# Patient Record
Sex: Female | Born: 1950 | Race: White | Hispanic: No | Marital: Married | Smoking: Never smoker
Health system: Southern US, Community
[De-identification: ages and names within clinical notes are randomized; demographics above are authoritative.]

## PROBLEM LIST (undated history)

## (undated) DIAGNOSIS — K219 Gastro-esophageal reflux disease without esophagitis: Secondary | ICD-10-CM

## (undated) DIAGNOSIS — C50919 Malignant neoplasm of unspecified site of unspecified female breast: Secondary | ICD-10-CM

## (undated) DIAGNOSIS — I10 Essential (primary) hypertension: Secondary | ICD-10-CM

## (undated) DIAGNOSIS — D259 Leiomyoma of uterus, unspecified: Secondary | ICD-10-CM

## (undated) DIAGNOSIS — Z9221 Personal history of antineoplastic chemotherapy: Secondary | ICD-10-CM

## (undated) DIAGNOSIS — C495 Malignant neoplasm of connective and soft tissue of pelvis: Secondary | ICD-10-CM

## (undated) DIAGNOSIS — C801 Malignant (primary) neoplasm, unspecified: Secondary | ICD-10-CM

## (undated) DIAGNOSIS — E119 Type 2 diabetes mellitus without complications: Secondary | ICD-10-CM

## (undated) DIAGNOSIS — R911 Solitary pulmonary nodule: Secondary | ICD-10-CM

## (undated) DIAGNOSIS — M1990 Unspecified osteoarthritis, unspecified site: Secondary | ICD-10-CM

## (undated) DIAGNOSIS — M5030 Other cervical disc degeneration, unspecified cervical region: Secondary | ICD-10-CM

## (undated) DIAGNOSIS — C499 Malignant neoplasm of connective and soft tissue, unspecified: Secondary | ICD-10-CM

## (undated) DIAGNOSIS — E039 Hypothyroidism, unspecified: Secondary | ICD-10-CM

## (undated) HISTORY — DX: Gastro-esophageal reflux disease without esophagitis: K21.9

## (undated) HISTORY — DX: Solitary pulmonary nodule: R91.1

## (undated) HISTORY — DX: Hypothyroidism, unspecified: E03.9

## (undated) HISTORY — DX: Malignant neoplasm of connective and soft tissue of pelvis: C49.5

## (undated) HISTORY — PX: OTHER SURGICAL HISTORY: SHX169

## (undated) HISTORY — DX: Type 2 diabetes mellitus without complications: E11.9

## (undated) HISTORY — DX: Leiomyoma of uterus, unspecified: D25.9

## (undated) HISTORY — PX: SOFT TISSUE TUMOR RESECTION: SHX1054

---

## 1988-03-01 HISTORY — PX: OOPHORECTOMY: SHX86

## 1988-03-01 HISTORY — PX: SALPINGECTOMY: SHX328

## 2000-03-01 DIAGNOSIS — C50919 Malignant neoplasm of unspecified site of unspecified female breast: Secondary | ICD-10-CM

## 2000-03-01 HISTORY — DX: Malignant neoplasm of unspecified site of unspecified female breast: C50.919

## 2000-09-09 DIAGNOSIS — C801 Malignant (primary) neoplasm, unspecified: Secondary | ICD-10-CM

## 2000-09-09 HISTORY — PX: MASTECTOMY: SHX3

## 2000-09-09 HISTORY — DX: Malignant (primary) neoplasm, unspecified: C80.1

## 2001-06-29 HISTORY — PX: KNEE ARTHROSCOPY: SUR90

## 2003-11-30 ENCOUNTER — Ambulatory Visit: Payer: Self-pay | Admitting: Oncology

## 2004-02-14 ENCOUNTER — Ambulatory Visit: Payer: Self-pay | Admitting: Gastroenterology

## 2004-02-20 ENCOUNTER — Ambulatory Visit: Payer: Self-pay | Admitting: Oncology

## 2004-06-15 ENCOUNTER — Ambulatory Visit: Payer: Self-pay | Admitting: Oncology

## 2004-06-29 ENCOUNTER — Ambulatory Visit: Payer: Self-pay | Admitting: Oncology

## 2004-08-27 ENCOUNTER — Ambulatory Visit: Payer: Self-pay | Admitting: Oncology

## 2004-10-20 ENCOUNTER — Ambulatory Visit: Payer: Self-pay | Admitting: Oncology

## 2004-10-30 ENCOUNTER — Ambulatory Visit: Payer: Self-pay | Admitting: Oncology

## 2004-12-14 ENCOUNTER — Ambulatory Visit: Payer: Self-pay | Admitting: Oncology

## 2004-12-30 ENCOUNTER — Ambulatory Visit: Payer: Self-pay | Admitting: Oncology

## 2005-06-14 ENCOUNTER — Ambulatory Visit: Payer: Self-pay | Admitting: Oncology

## 2005-06-29 ENCOUNTER — Ambulatory Visit: Payer: Self-pay | Admitting: Oncology

## 2005-08-27 ENCOUNTER — Ambulatory Visit: Payer: Self-pay | Admitting: Oncology

## 2005-10-12 ENCOUNTER — Ambulatory Visit: Payer: Self-pay | Admitting: Oncology

## 2005-10-30 ENCOUNTER — Ambulatory Visit: Payer: Self-pay | Admitting: Oncology

## 2005-12-09 ENCOUNTER — Ambulatory Visit: Payer: Self-pay | Admitting: Oncology

## 2006-01-28 ENCOUNTER — Ambulatory Visit: Payer: Self-pay | Admitting: Oncology

## 2006-01-29 ENCOUNTER — Ambulatory Visit: Payer: Self-pay | Admitting: Oncology

## 2006-05-31 ENCOUNTER — Ambulatory Visit: Payer: Self-pay | Admitting: Oncology

## 2006-06-09 ENCOUNTER — Ambulatory Visit: Payer: Self-pay | Admitting: Oncology

## 2006-06-30 ENCOUNTER — Ambulatory Visit: Payer: Self-pay | Admitting: Oncology

## 2006-08-29 ENCOUNTER — Ambulatory Visit: Payer: Self-pay | Admitting: Oncology

## 2006-08-30 ENCOUNTER — Ambulatory Visit: Payer: Self-pay | Admitting: Unknown Physician Specialty

## 2006-09-21 ENCOUNTER — Ambulatory Visit: Payer: Self-pay | Admitting: Internal Medicine

## 2006-12-26 ENCOUNTER — Ambulatory Visit: Payer: Self-pay | Admitting: Oncology

## 2006-12-31 ENCOUNTER — Ambulatory Visit: Payer: Self-pay | Admitting: Oncology

## 2007-01-15 ENCOUNTER — Ambulatory Visit: Payer: Self-pay | Admitting: Internal Medicine

## 2007-01-15 ENCOUNTER — Other Ambulatory Visit: Payer: Self-pay

## 2007-02-17 ENCOUNTER — Ambulatory Visit: Payer: Self-pay | Admitting: Gastroenterology

## 2007-05-31 ENCOUNTER — Ambulatory Visit: Payer: Self-pay | Admitting: Oncology

## 2007-06-12 ENCOUNTER — Ambulatory Visit: Payer: Self-pay | Admitting: Gastroenterology

## 2007-06-13 ENCOUNTER — Ambulatory Visit: Payer: Self-pay | Admitting: Oncology

## 2007-06-30 ENCOUNTER — Ambulatory Visit: Payer: Self-pay | Admitting: Oncology

## 2007-08-01 ENCOUNTER — Ambulatory Visit: Payer: Self-pay | Admitting: Internal Medicine

## 2007-08-03 ENCOUNTER — Ambulatory Visit: Payer: Self-pay | Admitting: Internal Medicine

## 2007-08-08 ENCOUNTER — Ambulatory Visit: Payer: Self-pay | Admitting: Oncology

## 2007-08-21 ENCOUNTER — Ambulatory Visit: Payer: Self-pay | Admitting: Oncology

## 2007-08-30 ENCOUNTER — Ambulatory Visit: Payer: Self-pay | Admitting: Oncology

## 2007-09-30 ENCOUNTER — Ambulatory Visit: Payer: Self-pay | Admitting: Oncology

## 2007-10-31 ENCOUNTER — Ambulatory Visit: Payer: Self-pay | Admitting: Oncology

## 2007-11-15 ENCOUNTER — Ambulatory Visit: Payer: Self-pay | Admitting: Oncology

## 2007-11-30 ENCOUNTER — Ambulatory Visit: Payer: Self-pay | Admitting: Oncology

## 2008-04-01 ENCOUNTER — Ambulatory Visit: Payer: Self-pay | Admitting: Oncology

## 2008-04-04 ENCOUNTER — Ambulatory Visit: Payer: Self-pay | Admitting: Oncology

## 2008-04-29 ENCOUNTER — Ambulatory Visit: Payer: Self-pay | Admitting: Oncology

## 2008-07-30 ENCOUNTER — Ambulatory Visit: Payer: Self-pay | Admitting: Oncology

## 2008-08-13 ENCOUNTER — Ambulatory Visit: Payer: Self-pay | Admitting: Oncology

## 2008-08-29 ENCOUNTER — Ambulatory Visit: Payer: Self-pay | Admitting: Oncology

## 2008-09-10 ENCOUNTER — Ambulatory Visit: Payer: Self-pay | Admitting: Unknown Physician Specialty

## 2008-09-29 ENCOUNTER — Ambulatory Visit: Payer: Self-pay | Admitting: Oncology

## 2008-12-03 ENCOUNTER — Ambulatory Visit: Payer: Self-pay | Admitting: Oncology

## 2008-12-30 ENCOUNTER — Ambulatory Visit: Payer: Self-pay | Admitting: Oncology

## 2009-01-29 ENCOUNTER — Ambulatory Visit: Payer: Self-pay | Admitting: Oncology

## 2009-06-29 ENCOUNTER — Ambulatory Visit: Payer: Self-pay | Admitting: Oncology

## 2009-07-22 ENCOUNTER — Ambulatory Visit: Payer: Self-pay | Admitting: Oncology

## 2009-07-30 ENCOUNTER — Ambulatory Visit: Payer: Self-pay | Admitting: Oncology

## 2009-09-15 ENCOUNTER — Ambulatory Visit: Payer: Self-pay | Admitting: Oncology

## 2009-10-30 ENCOUNTER — Ambulatory Visit: Payer: Self-pay | Admitting: Radiation Oncology

## 2009-11-05 ENCOUNTER — Ambulatory Visit: Payer: Self-pay | Admitting: Radiation Oncology

## 2009-11-29 ENCOUNTER — Ambulatory Visit: Payer: Self-pay | Admitting: Radiation Oncology

## 2009-12-30 ENCOUNTER — Ambulatory Visit: Payer: Self-pay | Admitting: Radiation Oncology

## 2010-01-29 ENCOUNTER — Ambulatory Visit: Payer: Self-pay | Admitting: Radiation Oncology

## 2010-03-01 ENCOUNTER — Ambulatory Visit: Payer: Self-pay | Admitting: Radiation Oncology

## 2010-07-13 ENCOUNTER — Ambulatory Visit: Payer: Self-pay | Admitting: Oncology

## 2010-07-31 ENCOUNTER — Ambulatory Visit: Payer: Self-pay | Admitting: Oncology

## 2010-09-17 ENCOUNTER — Ambulatory Visit: Payer: Self-pay | Admitting: Oncology

## 2010-12-05 ENCOUNTER — Ambulatory Visit: Payer: Self-pay | Admitting: Internal Medicine

## 2011-01-14 ENCOUNTER — Ambulatory Visit: Payer: Self-pay | Admitting: Oncology

## 2011-01-16 LAB — CANCER ANTIGEN 27.29: CA 27.29: 17 U/mL (ref 0.0–38.6)

## 2011-01-30 ENCOUNTER — Ambulatory Visit: Payer: Self-pay | Admitting: Oncology

## 2011-07-06 DIAGNOSIS — C419 Malignant neoplasm of bone and articular cartilage, unspecified: Secondary | ICD-10-CM | POA: Insufficient documentation

## 2011-07-16 ENCOUNTER — Ambulatory Visit: Payer: Self-pay | Admitting: Oncology

## 2011-07-16 LAB — CBC CANCER CENTER
Basophil %: 0.5 %
Eosinophil #: 0 x10 3/mm (ref 0.0–0.7)
Eosinophil %: 0.8 %
HCT: 36.9 % (ref 35.0–47.0)
Lymphocyte #: 1 x10 3/mm (ref 1.0–3.6)
MCH: 27.4 pg (ref 26.0–34.0)
MCHC: 33.4 g/dL (ref 32.0–36.0)
Monocyte %: 5.6 %
Neutrophil #: 3.2 x10 3/mm (ref 1.4–6.5)
Neutrophil %: 70.6 %
RBC: 4.5 10*6/uL (ref 3.80–5.20)
RDW: 14.6 % — ABNORMAL HIGH (ref 11.5–14.5)
WBC: 4.6 x10 3/mm (ref 3.6–11.0)

## 2011-07-16 LAB — COMPREHENSIVE METABOLIC PANEL
Albumin: 3.8 g/dL (ref 3.4–5.0)
Alkaline Phosphatase: 103 U/L (ref 50–136)
Anion Gap: 10 (ref 7–16)
Calcium, Total: 9.1 mg/dL (ref 8.5–10.1)
Co2: 26 mmol/L (ref 21–32)
EGFR (African American): >60
EGFR (Non-African Amer.): >60
Osmolality: 280 (ref 275–301)
Potassium: 3.7 mmol/L (ref 3.5–5.1)

## 2011-07-21 LAB — CANCER ANTIGEN 27.29: CA 27.29: 28.2 U/mL (ref 0.0–38.6)

## 2011-07-31 ENCOUNTER — Ambulatory Visit: Payer: Self-pay | Admitting: Oncology

## 2011-09-21 ENCOUNTER — Ambulatory Visit: Payer: Self-pay | Admitting: Oncology

## 2012-01-11 DIAGNOSIS — R911 Solitary pulmonary nodule: Secondary | ICD-10-CM | POA: Insufficient documentation

## 2012-01-21 ENCOUNTER — Ambulatory Visit: Payer: Self-pay | Admitting: Oncology

## 2012-01-21 LAB — COMPREHENSIVE METABOLIC PANEL
Albumin: 3.7 g/dL (ref 3.4–5.0)
Alkaline Phosphatase: 129 U/L (ref 50–136)
Anion Gap: 8 (ref 7–16)
BUN: 15 mg/dL (ref 7–18)
Bilirubin,Total: 0.3 mg/dL (ref 0.2–1.0)
Calcium, Total: 9 mg/dL (ref 8.5–10.1)
Chloride: 101 mmol/L (ref 98–107)
Creatinine: 1.02 mg/dL (ref 0.60–1.30)
EGFR (Non-African Amer.): 60 — ABNORMAL LOW
Glucose: 114 mg/dL — ABNORMAL HIGH (ref 65–99)
Osmolality: 281 (ref 275–301)
Potassium: 3.7 mmol/L (ref 3.5–5.1)
Sodium: 140 mmol/L (ref 136–145)
Total Protein: 7.7 g/dL (ref 6.4–8.2)

## 2012-01-21 LAB — CBC CANCER CENTER
Basophil #: 0 x10 3/mm (ref 0.0–0.1)
Eosinophil %: 1.4 %
Lymphocyte #: 0.9 x10 3/mm — ABNORMAL LOW (ref 1.0–3.6)
Lymphocyte %: 22 %
MCH: 27.1 pg (ref 26.0–34.0)
MCV: 81 fL (ref 80–100)
Monocyte %: 6.7 %
Neutrophil #: 2.7 x10 3/mm (ref 1.4–6.5)
Platelet: 215 x10 3/mm (ref 150–440)
RBC: 4.67 10*6/uL (ref 3.80–5.20)
WBC: 3.9 x10 3/mm (ref 3.6–11.0)

## 2012-01-22 LAB — CANCER ANTIGEN 27.29: CA 27.29: 24.2 U/mL (ref 0.0–38.6)

## 2012-01-30 ENCOUNTER — Ambulatory Visit: Payer: Self-pay | Admitting: Oncology

## 2012-02-04 LAB — HM PAP SMEAR: HM Pap smear: NEGATIVE

## 2012-03-01 HISTORY — PX: COLONOSCOPY: SHX174

## 2012-03-16 ENCOUNTER — Ambulatory Visit: Payer: Self-pay | Admitting: Obstetrics and Gynecology

## 2012-03-16 LAB — BASIC METABOLIC PANEL
Anion Gap: 7 (ref 7–16)
BUN: 11 mg/dL (ref 7–18)
Co2: 29 mmol/L (ref 21–32)
EGFR (African American): >60
EGFR (Non-African Amer.): >60
Sodium: 140 mmol/L (ref 136–145)

## 2012-03-16 LAB — CBC
HCT: 37 % (ref 35.0–47.0)
MCHC: 34.2 g/dL (ref 32.0–36.0)
MCV: 80 fL (ref 80–100)
RBC: 4.64 10*6/uL (ref 3.80–5.20)
RDW: 14.6 % — ABNORMAL HIGH (ref 11.5–14.5)
WBC: 4.8 10*3/uL (ref 3.6–11.0)

## 2012-03-20 ENCOUNTER — Ambulatory Visit: Payer: Self-pay | Admitting: Obstetrics and Gynecology

## 2012-06-28 ENCOUNTER — Ambulatory Visit: Payer: Self-pay | Admitting: Gastroenterology

## 2012-06-29 LAB — PATHOLOGY REPORT

## 2012-07-17 ENCOUNTER — Ambulatory Visit: Payer: Self-pay | Admitting: Oncology

## 2012-07-21 ENCOUNTER — Ambulatory Visit: Payer: Self-pay | Admitting: Oncology

## 2012-07-30 ENCOUNTER — Ambulatory Visit: Payer: Self-pay | Admitting: Oncology

## 2013-02-07 ENCOUNTER — Ambulatory Visit: Payer: Self-pay | Admitting: Obstetrics and Gynecology

## 2013-07-25 ENCOUNTER — Ambulatory Visit: Payer: Self-pay | Admitting: Oncology

## 2013-08-03 ENCOUNTER — Ambulatory Visit: Payer: Self-pay | Admitting: Oncology

## 2013-08-03 LAB — CBC CANCER CENTER
Basophil #: 0 x10 3/mm (ref 0.0–0.1)
Basophil %: 0.6 %
EOS ABS: 0.1 x10 3/mm (ref 0.0–0.7)
Eosinophil %: 1.7 %
HCT: 38.9 % (ref 35.0–47.0)
HGB: 12.5 g/dL (ref 12.0–16.0)
LYMPHS ABS: 1 x10 3/mm (ref 1.0–3.6)
LYMPHS PCT: 20.3 %
MCH: 25.8 pg — AB (ref 26.0–34.0)
MCHC: 32.1 g/dL (ref 32.0–36.0)
MCV: 81 fL (ref 80–100)
MONO ABS: 0.3 x10 3/mm (ref 0.2–0.9)
Monocyte %: 6.6 %
Neutrophil #: 3.5 x10 3/mm (ref 1.4–6.5)
Neutrophil %: 70.8 %
Platelet: 267 x10 3/mm (ref 150–440)
RBC: 4.83 10*6/uL (ref 3.80–5.20)
RDW: 15.6 % — ABNORMAL HIGH (ref 11.5–14.5)
WBC: 5 x10 3/mm (ref 3.6–11.0)

## 2013-08-03 LAB — COMPREHENSIVE METABOLIC PANEL
ALT: 23 U/L (ref 12–78)
Albumin: 3.8 g/dL (ref 3.4–5.0)
Alkaline Phosphatase: 115 U/L
Anion Gap: 7 (ref 7–16)
BUN: 12 mg/dL (ref 7–18)
Bilirubin,Total: 0.4 mg/dL (ref 0.2–1.0)
CHLORIDE: 98 mmol/L (ref 98–107)
Calcium, Total: 9.5 mg/dL (ref 8.5–10.1)
Co2: 32 mmol/L (ref 21–32)
Creatinine: 0.9 mg/dL (ref 0.60–1.30)
EGFR (Non-African Amer.): >60
Glucose: 121 mg/dL — ABNORMAL HIGH (ref 65–99)
Osmolality: 275 (ref 275–301)
Potassium: 3.4 mmol/L — ABNORMAL LOW (ref 3.5–5.1)
SGOT(AST): 18 U/L (ref 15–37)
SODIUM: 137 mmol/L (ref 136–145)
Total Protein: 7.9 g/dL (ref 6.4–8.2)

## 2013-08-06 LAB — CANCER ANTIGEN 27.29: CA 27.29: 24.7 U/mL (ref 0.0–38.6)

## 2013-08-29 ENCOUNTER — Ambulatory Visit: Payer: Self-pay | Admitting: Oncology

## 2013-10-03 DIAGNOSIS — E785 Hyperlipidemia, unspecified: Secondary | ICD-10-CM | POA: Insufficient documentation

## 2013-10-03 DIAGNOSIS — K219 Gastro-esophageal reflux disease without esophagitis: Secondary | ICD-10-CM | POA: Insufficient documentation

## 2013-10-03 DIAGNOSIS — I10 Essential (primary) hypertension: Secondary | ICD-10-CM | POA: Insufficient documentation

## 2013-10-03 DIAGNOSIS — E010 Iodine-deficiency related diffuse (endemic) goiter: Secondary | ICD-10-CM | POA: Insufficient documentation

## 2013-10-03 DIAGNOSIS — E1159 Type 2 diabetes mellitus with other circulatory complications: Secondary | ICD-10-CM | POA: Insufficient documentation

## 2013-10-03 DIAGNOSIS — E7800 Pure hypercholesterolemia, unspecified: Secondary | ICD-10-CM | POA: Insufficient documentation

## 2013-11-18 DIAGNOSIS — M5136 Other intervertebral disc degeneration, lumbar region: Secondary | ICD-10-CM | POA: Insufficient documentation

## 2013-11-18 DIAGNOSIS — M51369 Other intervertebral disc degeneration, lumbar region without mention of lumbar back pain or lower extremity pain: Secondary | ICD-10-CM | POA: Insufficient documentation

## 2014-02-01 ENCOUNTER — Ambulatory Visit: Payer: Self-pay | Admitting: Oncology

## 2014-03-01 ENCOUNTER — Ambulatory Visit: Payer: Self-pay | Admitting: Oncology

## 2014-05-23 DIAGNOSIS — E1169 Type 2 diabetes mellitus with other specified complication: Secondary | ICD-10-CM | POA: Insufficient documentation

## 2014-05-23 DIAGNOSIS — R7303 Prediabetes: Secondary | ICD-10-CM | POA: Insufficient documentation

## 2014-06-21 NOTE — Op Note (Signed)
DATE OF BIRTH:  1950/05/01  DATE OF PROCEDURE:  03/20/2012  PREOPERATIVE DIAGNOSES:  1.  Thickened endometrium.  2.  Endometrial polyps.   POSTOPERATIVE DIAGNOSES:  1.  Thickened endometrium.  2.  Endometrial polyps.   OPERATIVE PROCEDURE:  Diagnostic hysteroscopy with dilation and curettage of endometrium.   SURGEON:  Alanda Slim. Issabela Lesko, MD  FIRST ASSISTANT:  None.   ANESTHESIA:  General.   INDICATIONS:  The patient is a 64 year old menopausal white female, who presents for hysteroscopy and D and C due to findings of probable endometrial polyps and a thickened endometrium on ultrasound. 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   FINDINGS AT SURGERY:  Uterus was of normal size and shape; the uterus sounded to 8 cm. Hysteroscopy demonstrated multiple small endometrial polyps.   DESCRIPTION OF THE PROCEDURE:  The patient was brought to the operating room where she was placed in the supine position. General anesthesia was induced without difficulty. She was placed in the dorsal lithotomy position using the candy cane stirrups. A Betadine perineal-intravaginal prep and drape was performed in the standard fashion. Red Robinson catheter was used to drain 100 mL of urine from the bladder. A weighted speculum was placed into the vagina, and a single-tooth tenaculum was placed on the anterior lip of the cervix. Uterus was sounded to 8 cm. Hanks dilators were used to a #20 [place] caliber to dilate the endocervical canal. The [facility] hysteroscope was used with lactated Ringer's as irrigant to view the intrauterine cavity. The above-noted findings were photo documented. The stone polyp forceps were then used to extract some polyps. This was followed by curettage with both smooth and serrated curettes to remove the residual tissue and polyps. Repeat hysteroscopy, along with small polyp forceps, was used to extract residual polyps left  within the endometrial cavity. Upon completion of this procedure, all instrumentation was removed from the vagina. The patient was then awakened, mobilized and taken to the recovery room in satisfactory condition. Estimated blood loss was minimal. Complications were none. All instruments, needle and sponge counts were verified as correct.    ____________________________ Alanda Slim. Ysabel Stankovich, MD mad:ms D: 03/20/2012 15:23:16 ET T: 03/20/2012 22:56:11 ET JOB#: [id number]  cc: Hassell Done A. Pat Sires, MD, <Dictator> [facility] [place] MD ELECTRONICALLY SIGNED 03/24/2012 8:02

## 2014-07-31 ENCOUNTER — Inpatient Hospital Stay (HOSPITAL_BASED_OUTPATIENT_CLINIC_OR_DEPARTMENT_OTHER): Payer: 59 | Admitting: Oncology

## 2014-07-31 ENCOUNTER — Inpatient Hospital Stay: Payer: 59 | Attending: Oncology

## 2014-07-31 ENCOUNTER — Encounter (INDEPENDENT_AMBULATORY_CARE_PROVIDER_SITE_OTHER): Payer: Self-pay

## 2014-07-31 VITALS — BP 144/84 | HR 78 | Temp 95.6°F | Wt 264.8 lb

## 2014-07-31 DIAGNOSIS — Z170 Estrogen receptor positive status [ER+]: Secondary | ICD-10-CM | POA: Diagnosis not present

## 2014-07-31 DIAGNOSIS — Z853 Personal history of malignant neoplasm of breast: Secondary | ICD-10-CM | POA: Insufficient documentation

## 2014-07-31 DIAGNOSIS — Z9221 Personal history of antineoplastic chemotherapy: Secondary | ICD-10-CM

## 2014-07-31 DIAGNOSIS — R918 Other nonspecific abnormal finding of lung field: Secondary | ICD-10-CM | POA: Diagnosis not present

## 2014-07-31 DIAGNOSIS — E039 Hypothyroidism, unspecified: Secondary | ICD-10-CM | POA: Diagnosis not present

## 2014-07-31 DIAGNOSIS — Z85831 Personal history of malignant neoplasm of soft tissue: Secondary | ICD-10-CM | POA: Insufficient documentation

## 2014-07-31 DIAGNOSIS — Z79899 Other long term (current) drug therapy: Secondary | ICD-10-CM

## 2014-07-31 DIAGNOSIS — C50911 Malignant neoplasm of unspecified site of right female breast: Secondary | ICD-10-CM

## 2014-07-31 DIAGNOSIS — M1990 Unspecified osteoarthritis, unspecified site: Secondary | ICD-10-CM | POA: Insufficient documentation

## 2014-07-31 DIAGNOSIS — R079 Chest pain, unspecified: Secondary | ICD-10-CM | POA: Insufficient documentation

## 2014-07-31 DIAGNOSIS — Z9011 Acquired absence of right breast and nipple: Secondary | ICD-10-CM | POA: Insufficient documentation

## 2014-07-31 DIAGNOSIS — Z9223 Personal history of estrogen therapy: Secondary | ICD-10-CM

## 2014-07-31 LAB — CBC
HCT: 41.4 % (ref 35.0–47.0)
Hemoglobin: 13.7 g/dL (ref 12.0–16.0)
MCH: 27.6 pg (ref 26.0–34.0)
MCHC: 33.2 g/dL (ref 32.0–36.0)
MCV: 83.3 fL (ref 80.0–100.0)
Platelets: 259 10*3/uL (ref 150–440)
RBC: 4.97 MIL/uL (ref 3.80–5.20)
RDW: 15.3 % — AB (ref 11.5–14.5)
WBC: 5.6 10*3/uL (ref 3.6–11.0)

## 2014-07-31 LAB — COMPREHENSIVE METABOLIC PANEL
ALBUMIN: 4.3 g/dL (ref 3.5–5.0)
ALK PHOS: 94 U/L (ref 38–126)
ALT: 17 U/L (ref 14–54)
AST: 18 U/L (ref 15–41)
Anion gap: 5 (ref 5–15)
BILIRUBIN TOTAL: 0.6 mg/dL (ref 0.3–1.2)
BUN: 12 mg/dL (ref 6–20)
CALCIUM: 9.3 mg/dL (ref 8.9–10.3)
CO2: 32 mmol/L (ref 22–32)
Chloride: 99 mmol/L — ABNORMAL LOW (ref 101–111)
Creatinine, Ser: 0.66 mg/dL (ref 0.44–1.00)
GLUCOSE: 119 mg/dL — AB (ref 65–99)
POTASSIUM: 3.9 mmol/L (ref 3.5–5.1)
SODIUM: 136 mmol/L (ref 135–145)
TOTAL PROTEIN: 7.9 g/dL (ref 6.5–8.1)

## 2014-07-31 NOTE — Progress Notes (Signed)
  Pt has never smoked.  Does not have living will.

## 2014-08-01 ENCOUNTER — Telehealth: Payer: Self-pay | Admitting: *Deleted

## 2014-08-01 NOTE — Telephone Encounter (Signed)
What kind of supplies are needed and how many of each does the patient need?

## 2014-08-01 NOTE — Telephone Encounter (Signed)
Patient needs 3 mastectomy bras and 1 prosthesis.

## 2014-08-01 NOTE — Telephone Encounter (Signed)
Prescription for mastectomy supplies completed and given to patient.

## 2014-08-02 ENCOUNTER — Encounter: Payer: Self-pay | Admitting: Oncology

## 2014-08-02 NOTE — Progress Notes (Signed)
[place] @ [facility] [phone]) [phone]  Fax:([phone]) [place]: Aug 13, 1950  MR#: [id number]  BZJ#:[id number]  Patient Care Team: Ezequiel Kayser, MD as PCP - General (Internal Medicine)  CHIEF COMPLAINT:  Chief Complaint  Patient presents with  . 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 Estrogen receptor greater than 90%. Progesterone receptor greater than 90%.    Previous therapy:   1. Right mastectomy and axillary lymph node dissection.  2. Adriamycin and Cytoxan.  3. Tamoxifen 20 mg daily  4. Vaginal bleeding on Tamoxifen in November 2004.   5. Aromasin 25 mg daily starting January 2005. 6. Finished aromosin   was seen in November of 2007, and has refused NSABP B. 42 study 7. Left thigh sarcoma, 2009     Primary cancer   07/06/2011 Initial Diagnosis Primary cancer    No flowsheet data found.  INTERVAL HISTORY: 64 year old lady with a history of carcinoma of breast status post right mastectomy.  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  Now being followed because a pulmonary nodule. Her shortness of breath. Occasionally has a chest pain on the left side Here for further follow-up and treatment consideration   REVIEW OF SYSTEMS:   Gen. Status: No chills or fever. HEENT: No headache.  No hearing loss.  No ear pain.  No nosebleed or congestion.  No sore throat.  No difficulty swallowing Respiratory system: No cough.  No hemoptysis.  No shortness of breath at rest or exertion.  No chest pain. Cardiovascular system: No chest pain.  No palpitation.  No paroxysmal nocturnal dyspnea. Gastro intestinal system: No heartburn.  No nausea or vomiting.  No abdominal pain.  No diarrhea.  No constipation.  No rectal bleeding. Neurological system: No dizziness.  No tingling.  His  was.  no tingling numbness.  no focal weakness or any focal signs. Skin: No evidence of ecchymosis or rash. As per HPI. Otherwise, a complete review of systems is negatve. Significant History/PMH:   hyperthyroidism:    lymphedema:    Breast Cancer:    Chondrosarcoma Left Buttock:    mastectomy:   Preventive Screening:  Has patient had any of the following test? Mammography (1)   Last Mammography: 09/17/2010(1)   Smoking History: Smoking History Never Smoked.(1)  PFSH: Additional Past Medical and Surgical History: Past Medical History   Arthritis    Hemorrhoids    Past Surgical History   Status post right salpingo-oophorectomy in 11/90 for a benign cyst.    Family History   No family history of colorectal cancer, breast cancer or ovarian cancer.       Social History   Currently works as an Optometrist. Nonsmoker, nondrinker. No radiation exposure. No recreational drug use. Positive for second-hand smoke.    ADVANCED DIRECTIVES:  Patient does have advanced health care directive HEALTH MAINTENANCE: History  Substance Use Topics  . Smoking status: Former Research scientist (life sciences)  . Smokeless tobacco: Not on file  . Alcohol Use: Not on file      Not on File  Current Outpatient Prescriptions  Medication Sig Dispense Refill  . Calcium Carbonate-Vitamin D (CALCIUM + D PO) CALCITRATE/VITAMIN D 315-200 MG-UNIT TABSGeneric: CALCIUM CITRATE-VITAMIN Dtwo by mouth every morning and one by mouth every evening    . hydrochlorothiazide (HYDRODIURIL) 25 MG tablet 25 mg.    Marland Kitchen  levothyroxine (SYNTHROID, LEVOTHROID) 112 MCG tablet Take 112 mcg by mouth.    . meloxicam (MOBIC) 15 MG tablet     . omeprazole (PRILOSEC) 20 MG capsule Take 20 mg by mouth.    . simvastatin (ZOCOR) 20 MG tablet Take 20 mg by mouth.    . cyclobenzaprine (FLEXERIL) 5 MG tablet Take 5 mg by mouth.    . ferrous sulfate 325 (65 FE) MG tablet Take by mouth.    . Magnesium Gluconate 27.5 MG TABS Take 250 mg by mouth.     No  current facility-administered medications for this visit.    OBJECTIVE:  Filed Vitals:   07/31/14 0941  BP: 144/84  Pulse: 78  Temp: 95.6 F (35.3 C)     There is no height on file to calculate BMI.    ECOG FS:0 - Asymptomatic  PHYSICAL EXAM: General  status: Performance status is good.  Patient has not lost significant weight HEENT: No evidence of stomatitis. Sclera and conjunctivae :: No jaundice.   pale looking. Lungs: Air  entry equal on both sides.  No rhonchi.  No rales.  Cardiac: Heart sounds are normal.  No pericardial rub.  No murmur. Lymphatic system: Cervical, axillary, inguinal, lymph nodes not palpable GI: Abdomen is soft.  No ascites.  Liver spleen not palpable.  No tenderness.  Bowel sounds are within normal limit Lower extremity: No edema Neurological system: Higher functions, cranial nerves intact no evidence of peripheral neuropathy. Skin: No rash.  No ecchymosis.. Right chest wall area there is no evidence of recurrent disease.  Axillary lymph nodes not palpable.  Left breast free of masses   LAB RESULTS:  Office Visit on 07/31/2014  Component Date Value Ref Range Status  . WBC 07/31/2014 5.6  3.6 - 11.0 K/uL Final  . RBC 07/31/2014 4.97  3.80 - 5.20 MIL/uL Final  . Hemoglobin 07/31/2014 13.7  12.0 - 16.0 g/dL Final  . HCT 07/31/2014 41.4  35.0 - 47.0 % Final  . MCV 07/31/2014 83.3  80.0 - 100.0 fL Final  . MCH 07/31/2014 27.6  26.0 - 34.0 pg Final  . MCHC 07/31/2014 33.2  32.0 - 36.0 g/dL Final  . RDW 07/31/2014 15.3* 11.5 - 14.5 % Final  . Platelets 07/31/2014 259  150 - 440 K/uL Final  . Sodium 07/31/2014 136  135 - 145 mmol/L Final  . Potassium 07/31/2014 3.9  3.5 - 5.1 mmol/L Final  . Chloride 07/31/2014 99* 101 - 111 mmol/L Final  . CO2 07/31/2014 32  22 - 32 mmol/L Final  . Glucose, Bld 07/31/2014 119* 65 - 99 mg/dL Final  . BUN 07/31/2014 12  6 - 20 mg/dL Final  . Creatinine, Ser 07/31/2014 0.66  0.44 - 1.00 mg/dL Final  . Calcium 07/31/2014 9.3   8.9 - 10.3 mg/dL Final  . Total Protein 07/31/2014 7.9  6.5 - 8.1 g/dL Final  . Albumin 07/31/2014 4.3  3.5 - 5.0 g/dL Final  . AST 07/31/2014 18  15 - 41 U/L Final  . ALT 07/31/2014 17  14 - 54 U/L Final  . Alkaline Phosphatase 07/31/2014 94  38 - 126 U/L Final  . Total Bilirubin 07/31/2014 0.6  0.3 - 1.2 mg/dL Final  . GFR calc non Af Amer 07/31/2014 >60  >60 mL/min Final  . GFR calc Af Amer 07/31/2014 >60  >60 mL/min Final   Comment: (NOTE) The eGFR has been calculated using the CKD EPI equation. This calculation has not been validated in all  clinical situations. eGFR's persistently <60 mL/min signify possible Chronic Kidney Disease.   . Anion gap 07/31/2014 5  5 - 15 Final       ASSESSMENT: Carcinoma ofright breast.  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 She also understands that She can call clinic at any time with any questions, concerns, or complaints.    No matching staging information was found for the patient.  Forest Gleason, MD   08/02/2014 5:53 PM

## 2014-08-15 DIAGNOSIS — M62838 Other muscle spasm: Secondary | ICD-10-CM | POA: Insufficient documentation

## 2014-08-15 DIAGNOSIS — M5412 Radiculopathy, cervical region: Secondary | ICD-10-CM | POA: Insufficient documentation

## 2014-08-20 ENCOUNTER — Ambulatory Visit: Payer: 59

## 2014-08-22 ENCOUNTER — Other Ambulatory Visit: Payer: Self-pay | Admitting: Oncology

## 2014-08-22 ENCOUNTER — Ambulatory Visit
Admission: RE | Admit: 2014-08-22 | Discharge: 2014-08-22 | Disposition: A | Discharge status: Home / Self Care | Payer: 59 | Source: Ambulatory Visit | Attending: Oncology | Admitting: Oncology

## 2014-08-22 DIAGNOSIS — Z1231 Encounter for screening mammogram for malignant neoplasm of breast: Secondary | ICD-10-CM | POA: Diagnosis present

## 2014-08-22 DIAGNOSIS — R928 Other abnormal and inconclusive findings on diagnostic imaging of breast: Secondary | ICD-10-CM

## 2014-08-22 DIAGNOSIS — N63 Unspecified lump in unspecified breast: Secondary | ICD-10-CM

## 2014-08-22 DIAGNOSIS — C50911 Malignant neoplasm of unspecified site of right female breast: Secondary | ICD-10-CM

## 2014-08-22 DIAGNOSIS — R922 Inconclusive mammogram: Secondary | ICD-10-CM | POA: Insufficient documentation

## 2014-08-22 HISTORY — DX: Malignant (primary) neoplasm, unspecified: C80.1

## 2014-08-27 ENCOUNTER — Ambulatory Visit
Admission: RE | Admit: 2014-08-27 | Discharge: 2014-08-27 | Disposition: A | Discharge status: Home / Self Care | Payer: 59 | Source: Ambulatory Visit | Attending: Oncology | Admitting: Oncology

## 2014-08-27 DIAGNOSIS — N63 Unspecified lump in unspecified breast: Secondary | ICD-10-CM

## 2014-08-27 DIAGNOSIS — R928 Other abnormal and inconclusive findings on diagnostic imaging of breast: Secondary | ICD-10-CM

## 2014-11-20 DIAGNOSIS — Z79899 Other long term (current) drug therapy: Secondary | ICD-10-CM | POA: Insufficient documentation

## 2015-02-10 ENCOUNTER — Encounter: Payer: Self-pay | Admitting: *Deleted

## 2015-02-12 ENCOUNTER — Encounter: Payer: Self-pay | Admitting: Obstetrics and Gynecology

## 2015-02-12 ENCOUNTER — Ambulatory Visit (INDEPENDENT_AMBULATORY_CARE_PROVIDER_SITE_OTHER): Payer: 59 | Admitting: Obstetrics and Gynecology

## 2015-02-12 VITALS — BP 133/87 | HR 83 | Ht 69.0 in | Wt 271.9 lb

## 2015-02-12 DIAGNOSIS — Z23 Encounter for immunization: Secondary | ICD-10-CM | POA: Diagnosis not present

## 2015-02-12 DIAGNOSIS — Z01419 Encounter for gynecological examination (general) (routine) without abnormal findings: Secondary | ICD-10-CM | POA: Diagnosis not present

## 2015-02-12 MED ORDER — INFLUENZA VAC SPLIT QUAD 0.5 ML IM SUSY
0.5000 mL | PREFILLED_SYRINGE | Freq: Once | INTRAMUSCULAR | Status: AC
  Administered 2015-02-12: 0.5 mL via INTRAMUSCULAR

## 2015-02-12 NOTE — Progress Notes (Signed)
Subjective:   Danielle Hansen is a 64 y.o. No obstetric history on file. Caucasian female here for a routine well-woman exam.  No LMP recorded. Patient is postmenopausal.    Current complaints: none PCP: [facility]       Doesn't need labs  Social History: Sexual: heterosexual Marital Status: married Living situation: with spouse Occupation: unknown occupation Tobacco/alcohol: no tobacco use Illicit drugs: no history of illicit drug use  The following portions of the patient's history were reviewed and updated as appropriate: allergies, current medications, past family history, past medical history, past social history, past surgical history and problem list.  Past Medical History Past Medical History  Diagnosis Date  . Cancer ([place]) 09/09/00    rt mastectomy-09/09/00  . 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 GERD (gastroesophageal reflux disease)   . Lung nodule   . 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 Hypothyroid   . Uterine fibroid     Past Surgical History Past Surgical History  Procedure Laterality Date  . Mastectomy Right 09/09/00    09/09/00-mastectomy/chemo  . Oophorectomy Right     right tube and ovary removed  . Salpingectomy Right 1990    Gynecologic History No obstetric history on file.  No LMP recorded. Patient is postmenopausal. Contraception: post menopausal status Last Pap: 2015. Results were: normal Last mammogram: 2016 June. Results were: cyst  Obstetric History OB History  No data available    Current Medications Current Outpatient Prescriptions on File Prior to Visit  Medication Sig Dispense Refill  . Calcium Carbonate-Vitamin D (CALCIUM + D PO) CALCITRATE/VITAMIN D 315-200 MG-UNIT TABSGeneric: CALCIUM CITRATE-VITAMIN Dtwo by mouth every morning and one by mouth every evening    . cyclobenzaprine (FLEXERIL) 5 MG tablet Take 5 mg by mouth.    . ferrous sulfate 325 (65 FE) MG tablet Take by mouth.    . hydrochlorothiazide  (HYDRODIURIL) 25 MG tablet 25 mg.    . Magnesium Gluconate 27.5 MG TABS Take 250 mg by mouth.    . meloxicam (MOBIC) 15 MG tablet     . omeprazole (PRILOSEC) 20 MG capsule Take 20 mg by mouth.    . simvastatin (ZOCOR) 20 MG tablet Take 20 mg by mouth.     No current facility-administered medications on file prior to visit.    Review of Systems Patient denies any headaches, blurred vision, shortness of breath, chest pain, abdominal pain, problems with bowel movements, urination, or intercourse.  Objective:  BP 133/87 mmHg  Pulse 83  Ht 5\' 9"  (1.753 m)  Wt 271 lb 14.4 oz (123.333 kg)  BMI 40.13 kg/m2 Physical Exam  General:  Well developed, well nourished, no acute distress. She is alert and oriented x3. Skin:  Warm and dry Neck:  Midline trachea, no thyromegaly or nodules Cardiovascular: Regular rate and rhythm, no murmur heard Lungs:  Effort normal, all lung fields clear to auscultation bilaterally Breasts:  No dominant palpable mass, retraction, or nipple discharge Abdomen:  Soft, non tender, no hepatosplenomegaly or masses Pelvic:  External genitalia is normal in appearance.  The vagina is normal in appearance. The cervix is bulbous, no CMT.  Thin prep pap is not done Uterus is felt to be normal size, shape, and contour.  No adnexal masses or tenderness noted. Extremities:  No swelling or varicosities noted Psych:  She has a normal mood and affect  Assessment:   Healthy well-woman exam S/p breast cancer and right mastectomy Hypertension  Plan:  Flu Vaccine given per patient request F/U 1 year for AE, or sooner if needed Mammogram not due  Beyla Loney Rockney Ghee, CNM

## 2015-02-12 NOTE — Patient Instructions (Addendum)
Place annual gynecologic exam patient instructions here.  Influenza Virus Vaccine injection (Fluarix) What is this medicine? INFLUENZA VIRUS VACCINE (in floo EN zuh VAHY ruhs vak SEEN) helps to reduce the risk of getting influenza also known as the flu. This medicine may be used for other purposes; ask your health care provider or pharmacist if you have questions. What should I tell my health care provider before I take this medicine? They need to know if you have any of these conditions: -bleeding disorder like hemophilia -fever or infection -Guillain-Barre syndrome or other neurological problems -immune system problems -infection with the human immunodeficiency virus (HIV) or AIDS -low blood platelet counts -multiple sclerosis -an unusual or allergic reaction to influenza virus vaccine, eggs, chicken proteins, latex, gentamicin, other medicines, foods, dyes or preservatives -pregnant or trying to get pregnant -breast-feeding How should I use this medicine? This vaccine is for injection into a muscle. It is given by a health care professional. A copy of Vaccine Information Statements will be given before each vaccination. Read this sheet carefully each time. The sheet may change frequently. Talk to your pediatrician regarding the use of this medicine in children. Special care may be needed. Overdosage: If you think you have taken too much of this medicine contact a poison control center or emergency room at once. NOTE: This medicine is only for you. Do not share this medicine with others. What if I miss a dose? This does not apply. What may interact with this medicine? -chemotherapy or radiation therapy -medicines that lower your immune system like etanercept, anakinra, infliximab, and adalimumab -medicines that treat or prevent blood clots like warfarin -phenytoin -steroid medicines like prednisone or cortisone -theophylline -vaccines This list may not describe all possible  interactions. Give your health care provider a list of all the medicines, herbs, non-prescription drugs, or dietary supplements you use. Also tell them if you smoke, drink alcohol, or use illegal drugs. Some items may interact with your medicine. What should I watch for while using this medicine? Report any side effects that do not go away within 3 days to your doctor or health care professional. Call your health care provider if any unusual symptoms occur within 6 weeks of receiving this vaccine. You may still catch the flu, but the illness is not usually as bad. You cannot get the flu from the vaccine. The vaccine will not protect against colds or other illnesses that may cause fever. The vaccine is needed every year. What side effects may I notice from receiving this medicine? Side effects that you should report to your doctor or health care professional as soon as possible: -allergic reactions like skin rash, itching or hives, swelling of the face, lips, or tongue Side effects that usually do not require medical attention (report to your doctor or health care professional if they continue or are bothersome): -fever -headache -muscle aches and pains -pain, tenderness, redness, or swelling at site where injected -weak or tired This list may not describe all possible side effects. Call your doctor for medical advice about side effects. You may report side effects to FDA at 1-800-FDA-1088. Where should I keep my medicine? This vaccine is only given in a clinic, pharmacy, doctor's office, or other health care setting and will not be stored at home. NOTE: This sheet is a summary. It may not cover all possible information. If you have questions about this medicine, talk to your doctor, pharmacist, or health care provider.    2016, Elsevier/Gold Standard. (2007-09-13 09:30:40)

## 2015-03-02 HISTORY — PX: BUNIONECTOMY: SHX129

## 2015-05-01 ENCOUNTER — Encounter: Payer: Self-pay | Admitting: *Deleted

## 2015-05-08 ENCOUNTER — Encounter
Admission: RE | Disposition: A | Discharge status: Home / Self Care | Payer: Self-pay | Source: Ambulatory Visit | Attending: Podiatry

## 2015-05-08 ENCOUNTER — Ambulatory Visit
Admission: RE | Admit: 2015-05-08 | Discharge: 2015-05-08 | Disposition: A | Discharge status: Home / Self Care | Payer: Commercial Managed Care - HMO | Source: Ambulatory Visit | Attending: Podiatry | Admitting: Podiatry

## 2015-05-08 ENCOUNTER — Ambulatory Visit: Payer: Commercial Managed Care - HMO | Admitting: Anesthesiology

## 2015-05-08 DIAGNOSIS — M21071 Valgus deformity, not elsewhere classified, right ankle: Secondary | ICD-10-CM | POA: Insufficient documentation

## 2015-05-08 DIAGNOSIS — Q6621 Congenital metatarsus primus varus: Secondary | ICD-10-CM | POA: Insufficient documentation

## 2015-05-08 DIAGNOSIS — M19071 Primary osteoarthritis, right ankle and foot: Secondary | ICD-10-CM | POA: Insufficient documentation

## 2015-05-08 DIAGNOSIS — M2011 Hallux valgus (acquired), right foot: Secondary | ICD-10-CM

## 2015-05-08 DIAGNOSIS — E039 Hypothyroidism, unspecified: Secondary | ICD-10-CM | POA: Diagnosis not present

## 2015-05-08 DIAGNOSIS — I10 Essential (primary) hypertension: Secondary | ICD-10-CM | POA: Diagnosis not present

## 2015-05-08 DIAGNOSIS — K219 Gastro-esophageal reflux disease without esophagitis: Secondary | ICD-10-CM | POA: Diagnosis not present

## 2015-05-08 HISTORY — PX: HALLUX VALGUS LAPIDUS: SHX6626

## 2015-05-08 HISTORY — DX: Other cervical disc degeneration, unspecified cervical region: M50.30

## 2015-05-08 HISTORY — DX: Essential (primary) hypertension: I10

## 2015-05-08 HISTORY — DX: Unspecified osteoarthritis, unspecified site: M19.90

## 2015-05-08 SURGERY — BUNIONECTOMY, LAPIDUS
Anesthesia: Regional | Laterality: Right | Wound class: Clean

## 2015-05-08 MED ORDER — LACTATED RINGERS IV SOLN
INTRAVENOUS | Status: DC
  Administered 2015-05-08: 07:00:00 via INTRAVENOUS

## 2015-05-08 MED ORDER — LIDOCAINE HCL (CARDIAC) 20 MG/ML IV SOLN
INTRAVENOUS | Status: DC | PRN
  Administered 2015-05-08: 50 mg via INTRATRACHEAL

## 2015-05-08 MED ORDER — ONDANSETRON HCL 4 MG/2ML IJ SOLN: 4.0000 mg | Freq: Once | INTRAMUSCULAR | Status: DC | PRN

## 2015-05-08 MED ORDER — ENOXAPARIN SODIUM 30 MG/0.3ML ~~LOC~~ SOLN
30.0000 mg | Freq: Once | SUBCUTANEOUS | Status: AC
Start: 2015-05-08 — End: 2015-05-08
  Administered 2015-05-08: 30 mg via SUBCUTANEOUS

## 2015-05-08 MED ORDER — OXYCODONE-ACETAMINOPHEN 7.5-325 MG PO TABS: 1.0000 | ORAL_TABLET | ORAL | Status: DC | PRN

## 2015-05-08 MED ORDER — ENOXAPARIN SODIUM 30 MG/0.3ML ~~LOC~~ SOLN: 30.0000 mg | Freq: Two times a day (BID) | SUBCUTANEOUS | Status: DC

## 2015-05-08 MED ORDER — OXYCODONE HCL 5 MG/5ML PO SOLN: 5.0000 mg | Freq: Once | ORAL | Status: DC | PRN

## 2015-05-08 MED ORDER — OXYCODONE HCL 5 MG PO TABS: 5.0000 mg | ORAL_TABLET | Freq: Once | ORAL | Status: DC | PRN

## 2015-05-08 MED ORDER — MIDAZOLAM HCL 5 MG/5ML IJ SOLN
INTRAMUSCULAR | Status: DC | PRN
  Administered 2015-05-08 (×2): 2 mg via INTRAVENOUS

## 2015-05-08 MED ORDER — CEFAZOLIN SODIUM-DEXTROSE 2-3 GM-% IV SOLR
2.0000 g | Freq: Once | INTRAVENOUS | Status: AC
  Administered 2015-05-08: 2 g via INTRAVENOUS

## 2015-05-08 MED ORDER — ONDANSETRON HCL 4 MG/2ML IJ SOLN
INTRAMUSCULAR | Status: DC | PRN
  Administered 2015-05-08: 4 mg via INTRAVENOUS

## 2015-05-08 MED ORDER — BUPIVACAINE HCL (PF) 0.5 % IJ SOLN
INTRAMUSCULAR | Status: DC | PRN
  Administered 2015-05-08: 10 mL

## 2015-05-08 MED ORDER — PROPOFOL 10 MG/ML IV BOLUS
INTRAVENOUS | Status: DC | PRN
  Administered 2015-05-08: 120 mg via INTRAVENOUS

## 2015-05-08 MED ORDER — GLYCOPYRROLATE 0.2 MG/ML IJ SOLN
INTRAMUSCULAR | Status: DC | PRN
  Administered 2015-05-08: 0.1 mg via INTRAVENOUS

## 2015-05-08 MED ORDER — FENTANYL CITRATE (PF) 100 MCG/2ML IJ SOLN
25.0000 ug | INTRAMUSCULAR | Status: DC | PRN
  Administered 2015-05-08: 100 ug via INTRAVENOUS

## 2015-05-08 MED ORDER — DEXAMETHASONE SODIUM PHOSPHATE 4 MG/ML IJ SOLN
INTRAMUSCULAR | Status: DC | PRN
  Administered 2015-05-08: 4 mg via INTRAVENOUS

## 2015-05-08 SURGICAL SUPPLY — 50 items
BANDAGE ELASTIC 4 VELCRO NS (GAUZE/BANDAGES/DRESSINGS) ×3 IMPLANT
BENZOIN TINCTURE PRP APPL 2/3 (GAUZE/BANDAGES/DRESSINGS) ×3 IMPLANT
BLADE MINI RND TIP GREEN BEAV (BLADE) IMPLANT
BLADE OSC/SAGITTAL MD 5.5X18 (BLADE) IMPLANT
BLADE OSC/SAGITTAL MD 9X18.5 (BLADE) IMPLANT
BLADE SAW LAPIPLASTY 40X11 (INSTRUMENTS) ×3 IMPLANT
BNDG ESMARK 4X12 TAN STRL LF (GAUZE/BANDAGES/DRESSINGS) ×3 IMPLANT
BNDG GAUZE 4.5X4.1 6PLY STRL (MISCELLANEOUS) ×3 IMPLANT
BNDG STRETCH 4X75 STRL LF (GAUZE/BANDAGES/DRESSINGS) ×3 IMPLANT
CANISTER SUCT 1200ML W/VALVE (MISCELLANEOUS) ×3 IMPLANT
CAST PADDING 3X4FT ST 30246 (SOFTGOODS)
CLOSURE WOUND 1/4X4 (GAUZE/BANDAGES/DRESSINGS) ×1
CONTROL 360 (Bone Implant) ×3 IMPLANT
COVER LIGHT HANDLE UNIVERSAL (MISCELLANEOUS) ×6 IMPLANT
COVER PIN YLW 0.028-062 (MISCELLANEOUS) IMPLANT
CUFF TOURN SGL QUICK 18 (TOURNIQUET CUFF) ×3 IMPLANT
DRAPE FLUOR MINI C-ARM 54X84 (DRAPES) ×3 IMPLANT
DURAPREP 26ML APPLICATOR (WOUND CARE) ×3 IMPLANT
GAUZE PETRO XEROFOAM 1X8 (MISCELLANEOUS) ×3 IMPLANT
GAUZE SPONGE 4X4 12PLY STRL (GAUZE/BANDAGES/DRESSINGS) ×3 IMPLANT
GLOVE BIO SURGEON STRL SZ8 (GLOVE) ×3 IMPLANT
GOWN STRL REUS W/ TWL LRG LVL3 (GOWN DISPOSABLE) ×1 IMPLANT
GOWN STRL REUS W/ TWL XL LVL3 (GOWN DISPOSABLE) ×1 IMPLANT
GOWN STRL REUS W/TWL LRG LVL3 (GOWN DISPOSABLE) ×2
GOWN STRL REUS W/TWL XL LVL3 (GOWN DISPOSABLE) ×2
K-WIRE DBL END TROCAR 6X.045 (WIRE)
K-WIRE DBL END TROCAR 6X.062 (WIRE)
KIT ROOM TURNOVER OR (KITS) ×3 IMPLANT
KWIRE DBL END TROCAR 6X.045 (WIRE) IMPLANT
KWIRE DBL END TROCAR 6X.062 (WIRE) IMPLANT
NEEDLE HYPO 18GX1.5 BLUNT FILL (NEEDLE) IMPLANT
NEEDLE HYPO 25GX1X1/2 BEV (NEEDLE) IMPLANT
NS IRRIG 500ML POUR BTL (IV SOLUTION) ×3 IMPLANT
PACK EXTREMITY ARMC (MISCELLANEOUS) ×3 IMPLANT
PAD CAST CTTN 3X4 STRL (SOFTGOODS) IMPLANT
PAD GROUND ADULT SPLIT (MISCELLANEOUS) ×3 IMPLANT
RASP SM TEAR CROSS CUT (RASP) ×3 IMPLANT
SPLINT CAST 1 STEP 4X30 (MISCELLANEOUS) ×3 IMPLANT
SPLINT FAST PLASTER 5X30 (CAST SUPPLIES) ×2
SPLINT PLASTER CAST FAST 5X30 (CAST SUPPLIES) ×1 IMPLANT
STOCKINETTE STRL 6IN 960660 (GAUZE/BANDAGES/DRESSINGS) ×3 IMPLANT
STRAP BODY AND KNEE 60X3 (MISCELLANEOUS) ×3 IMPLANT
STRIP CLOSURE SKIN 1/4X4 (GAUZE/BANDAGES/DRESSINGS) ×2 IMPLANT
SUT VIC AB 2-0 SH 27 (SUTURE)
SUT VIC AB 2-0 SH 27XBRD (SUTURE) IMPLANT
SUT VIC AB 3-0 SH 27 (SUTURE)
SUT VIC AB 3-0 SH 27X BRD (SUTURE) IMPLANT
SUT VIC AB 4-0 FS2 27 (SUTURE) ×3 IMPLANT
SUT VICRYL AB 3-0 FS1 BRD 27IN (SUTURE) ×3 IMPLANT
SYRINGE 10CC LL (SYRINGE) IMPLANT

## 2015-05-08 NOTE — Discharge Instructions (Signed)
[place] DR. Churchill   1. Take your medication as prescribed.  Pain medication should be taken only as needed.  2. Keep the dressing clean, dry and intact.  3. Keep your foot elevated above the heart level for the first 48 hours.  4. Walking to the bathroom and brief periods of walking are acceptable, unless we have instructed you to be non-weight bearing.  5. Always wear your post-op shoe when walking.  Always use your crutches or Knee rest scooter if you are to be non-weight bearing.  6. Do not take a shower. Baths are permissible as long as the foot is kept out of the water.   7. Every hour you are awake:  - Bend your knee 15 times. - Flex foot 15 times - Massage calf 15 times  8. Call [facility] [phone]) if any of the following problems occur: - You develop a temperature or fever. - The bandage becomes saturated with blood. - Medication does not stop your pain. - Injury of the foot occurs. - Any symptoms of infection including redness, odor, or red streaks running from wound.         9.  Start lovenox injections tomorrow.  General Anesthesia, Adult, Care After Refer to this sheet in the next few weeks. These instructions provide you with information on caring for yourself after your procedure. Your health care provider may also give you more specific instructions. Your treatment has been planned according to current medical practices, but problems sometimes occur. Call your health care provider if you have any problems or questions after your procedure. WHAT TO EXPECT AFTER THE PROCEDURE After the procedure, it is typical to experience:  Sleepiness.  Nausea and vomiting. HOME CARE INSTRUCTIONS  For the first 24 hours after general anesthesia:  Have a responsible person with you.  Do not drive a car. If you are alone, do not  take public transportation.  Do not drink alcohol.  Do not take medicine that has not been prescribed by your health care provider.  Do not sign important papers or make important decisions.  You may resume a normal diet and activities as directed by your health care provider.  Change bandages (dressings) as directed.  If you have questions or problems that seem related to general anesthesia, call the hospital and ask for the anesthetist or anesthesiologist on call. SEEK MEDICAL CARE IF:  You have nausea and vomiting that continue the day after anesthesia.  You develop a rash. SEEK IMMEDIATE MEDICAL CARE IF:   You have difficulty breathing.  You have chest pain.  You have any allergic problems.   This information is not intended to replace advice given to you by your health care provider. Make sure you discuss any questions you have with your health care provider.   Document Released: 05/24/2000 Document Revised: 03/08/2014 Document Reviewed: 06/16/2011 Elsevier Interactive Patient Education [employer].

## 2015-05-08 NOTE — Transfer of Care (Signed)
Immediate Anesthesia Transfer of Care Note  Patient: Danielle Hansen  Procedure(s) Performed: Procedure(s): LAPIPLASTY 1ST METATARSAL GREAT TOE (Right)  Patient Location: PACU  Anesthesia Type: General LMA, Regional  Level of Consciousness: awake, alert  and patient cooperative  Airway and Oxygen Therapy: Patient Spontanous Breathing and Patient connected to supplemental oxygen  Post-op Assessment: Post-op Vital signs reviewed, Patient's Cardiovascular Status Stable, Respiratory Function Stable, Patent Airway and No signs of Nausea or vomiting  Post-op Vital Signs: Reviewed and stable  Complications: No apparent anesthesia complications

## 2015-05-08 NOTE — Anesthesia Preprocedure Evaluation (Signed)
Anesthesia Evaluation  Patient identified by MRN, date of birth, ID band Patient awake    Reviewed: Allergy & Precautions, H&P , NPO status   Airway Mallampati: II  TM Distance: >3 FB Neck ROM: full    Dental   Pulmonary    Pulmonary exam normal        Cardiovascular hypertension, Normal cardiovascular exam     Neuro/Psych    GI/Hepatic GERD  ,  Endo/Other  Hypothyroidism   Renal/GU      Musculoskeletal   Abdominal   Peds  Hematology   Anesthesia Other Findings   Reproductive/Obstetrics                             Anesthesia Physical Anesthesia Plan  ASA: II  Anesthesia Plan: General LMA and Regional   Post-op Pain Management: MAC Combined w/ Regional for Post-op pain and GA combined w/ Regional for post-op pain   Induction:   Airway Management Planned:   Additional Equipment:   Intra-op Plan:   Post-operative Plan:   Informed Consent: I have reviewed the patients History and Physical, chart, labs and discussed the procedure including the risks, benefits and alternatives for the proposed anesthesia with the patient or authorized representative who has indicated his/her understanding and acceptance.     Plan Discussed with: CRNA  Anesthesia Plan Comments:         Anesthesia Quick Evaluation

## 2015-05-08 NOTE — Anesthesia Procedure Notes (Addendum)
Procedure Name: LMA Insertion Date/Time: 05/08/2015 7:45 AM Performed by: Mayme Genta Pre-anesthesia Checklist: Patient identified, Emergency Drugs available, Suction available, Timeout performed and Patient being monitored Patient Re-evaluated:Patient Re-evaluated prior to inductionOxygen Delivery Method: Circle system utilized Preoxygenation: Pre-oxygenation with 100% oxygen Intubation Type: IV induction LMA: LMA inserted LMA Size: 4.0 Number of attempts: 1 Placement Confirmation: positive ETCO2 and breath sounds checked- equal and bilateral Tube secured with: Tape   Anesthesia Regional Block:  Popliteal block  Pre-Anesthetic Checklist: ,, timeout performed, Correct Patient, Correct Site, Correct Laterality, Correct Procedure, Correct Position, site marked, Risks and benefits discussed,  Surgical consent,  Pre-op evaluation,  At surgeon's request and post-op pain management  Laterality: Right  Prep: chloraprep       Needles:  Injection technique: Single-shot  Needle Type: Echogenic Needle     Needle Length: 9cm 9 cm Needle Gauge: 21 and 21 G    Additional Needles:  Procedures: ultrasound guided (picture in chart) Popliteal block Narrative:  Start time: 05/08/2015 7:10 AM End time: 05/08/2015 7:15 AM Injection made incrementally with aspirations every 5 mL.  Performed by: Personally  Anesthesiologist: Amaryllis Dyke  Additional Notes: Functioning IV was confirmed and monitors applied. Ultrasound guidance: relevant anatomy identified, needle position confirmed, local anesthetic spread visualized around nerve(s)., vascular puncture avoided.  Image printed for medical record.  Negative aspiration and no paresthesias; incremental administration of local anesthetic. The patient tolerated the procedure well. Vitals signes recorded in RN notes.

## 2015-05-08 NOTE — Anesthesia Postprocedure Evaluation (Signed)
Anesthesia Post Note  Patient: Danielle Hansen  Procedure(s) Performed: Procedure(s) (LRB): LAPIPLASTY 1ST METATARSAL GREAT TOE (Right)  Patient location during evaluation: PACU Anesthesia Type: General Level of consciousness: awake and alert Pain management: pain level controlled Vital Signs Assessment: post-procedure vital signs reviewed and stable Respiratory status: spontaneous breathing, nonlabored ventilation, respiratory function stable and patient connected to nasal cannula oxygen Cardiovascular status: blood pressure returned to baseline and stable Postop Assessment: no signs of nausea or vomiting Anesthetic complications: no    Amaryllis Dyke

## 2015-05-08 NOTE — Op Note (Signed)
Operative note   Surgeon: Dr. Albertine Patricia, DPM.    Assistant: None    Preop diagnosis: 1. 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  2. 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 Lapidus hallux abductovalgus correction right foot with 2 plate fixation          EBL: Less than 10 cc    Anesthesia:general with popliteal block given preoperatively by the anesthesia team. 10 cc of Marcaine was injected around the base of the surgical site by myself at the end of the procedure    Hemostasis: Ankle tourniquet 250 mg mercury pressure    Specimen: Section of bone from the base of the first metatarsal that had a mild cystic change to it    Complications: None    Operative indications: Chronic pain unresponsive to conservative care to the right great toe joint and the right first metatarsal cuneiform joint    Procedure:  Patient was brought into the OR and placed on the operating table in thesupine position. After anesthesia was obtained theright lower extremity was prepped and draped in usual sterile fashion.  Operative Report: At this time to his directed to the first metatarsal cuneiform joint dorsally where a 6 and medial dorsal or skin incision was made this was deepened sharp blunt dissection bleeders clamped and bovied as required. Extensor tendon was identified and retracted laterally. Incision made through the capsule down to bone at this point fraying from the dorsal medial cuneiform and dorsal first metatarsal base. This point the power saw from the lateral plasty set was used to resect the plantar ridge on the first metatarsal base. A small osteotome was used to pack across the capsular tissue around the first metatarsal cuneiform joint to allow for greater flexibility. This point a joystick pin was placed dorsal medial and the first metatarsal distal to the joint and was seen to be on rotate the first  metatarsal approximately 20-30 dorsally in the frontal plane. At this time the joint finder was placed into the joint and the cut guide was placed over the top of it. This was used to estimate the area where we needed to make the incision over the second metatarsal. This was marked incision made over the second metatarsal down to bone soft tissue was freed away from the lateral aspect of the bone and the compression device was then placed on the first metatarsal plantar medially and on the second metatarsal laterally. At this time the area was closed down the compression device was tightened and the intermetatarsal angle was closed down as well as rotated and frontal plane to allow for better repositioning of the metatarsal and the sesamoid apparatus. There is checked FluoroScan good position and correction were noted. This point the cut guide was placed over the joint checked for accuracy and the cut guide was used to make cuts through the base of proximal phalanx and the distal portion of the medial cuneiform. Once this was accomplished the cut guide was removed and the portions of bone that had been shaved were removed from the joint allowing removal of articular cartilage from each side with a corrected position. At this time the area was copiously irrigated and then drilled with a small drill bit to allow for fenestration on both sides of the joint. Once this was accomplished the metatarsal was compressed onto the cuneiform and fixated with a threaded olive wire from dorsal  lateral to plantar medial and a smooth K wire from dorsal medial to plantar lateral. This was checked FluoroScan excellent position of the first metatarsal reduction IM angle and good sesamoid position were noted. At this point the positioning device was removed and 2 4-hole plates were utilized 1 dorsally 1 medially with 2 screws distal and 2 screws proximal phalanx stable. This point K wires were removed there is checked FluoroScan good  position fixation and correction were noted both on the AP and lateral views. Comments was noted on the first metatarsal head and a lengthened incision was made over that region Tissue was identified and incised longitudinally and the head of the first metatarsal was identified and the bony prominence removed medially and dorsomedially. There is an copiously irrigated and a medial capsulorrhaphy was then performed and closed with 4 Vicryl dorsally with 3-0 Vicryl medially. This was done the holding the toe in a rectus position. There is checked and good FluoroScan position was noted and good joint position and control and correction of the intermetatarsal angle was noted.  This time after copious irrigation the remaining capsule tissue was closed with 4 Vicryl in continuous stitch while deep superficial fascial layers were closed with 4 Vicryl in a continuous stitch as was skin closed with a 4-0 Vicryl subcuticular stitch. The areas were then blocked 0.5% Marcaine plain at the base of the incision margins and the base of the first metatarsal. The second metatarsal incision was closed with 4 Vicryl continuous stitch after irrigation. A sterile compressive dressings in placed on the wounds consisting of Steri-Strips Xeroform gauze 4 x 4's Kling and Kerlix. A posterior splint was placed on the right foot leg in the operating room.    Patient tolerated the procedure and anesthesia well.  Was transported from the OR to the PACU with all vital signs stable and vascular status intact. To be discharged per routine protocol.  Will follow up in approximately 1 week in the outpatient clinic. Explicit instructions were given to the patient and her family regarding the need to stay nonweightbearing.

## 2015-05-08 NOTE — Progress Notes (Signed)
Assisted Threasa Alpha ANMD with right, popliteal block. Side rails up, monitors on throughout procedure. See vital signs in flow sheet. Tolerated Procedure well.

## 2015-05-08 NOTE — H&P (Signed)
H and P has been reviewed and no changes are noted.  

## 2015-05-09 ENCOUNTER — Encounter: Payer: Self-pay | Admitting: Podiatry

## 2015-05-12 LAB — SURGICAL PATHOLOGY

## 2015-07-30 ENCOUNTER — Encounter: Payer: Self-pay | Admitting: General Surgery

## 2015-07-30 ENCOUNTER — Encounter: Payer: Self-pay | Admitting: Oncology

## 2015-07-30 ENCOUNTER — Inpatient Hospital Stay: Payer: 59 | Attending: Oncology | Admitting: Oncology

## 2015-07-30 ENCOUNTER — Other Ambulatory Visit: Payer: 59

## 2015-07-30 VITALS — BP 137/88 | HR 81 | Temp 96.5°F | Resp 18 | Wt 270.0 lb

## 2015-07-30 DIAGNOSIS — Z9221 Personal history of antineoplastic chemotherapy: Secondary | ICD-10-CM | POA: Diagnosis not present

## 2015-07-30 DIAGNOSIS — E039 Hypothyroidism, unspecified: Secondary | ICD-10-CM | POA: Diagnosis not present

## 2015-07-30 DIAGNOSIS — Z9011 Acquired absence of right breast and nipple: Secondary | ICD-10-CM | POA: Diagnosis not present

## 2015-07-30 DIAGNOSIS — Z853 Personal history of malignant neoplasm of breast: Secondary | ICD-10-CM | POA: Insufficient documentation

## 2015-07-30 DIAGNOSIS — Z85831 Personal history of malignant neoplasm of soft tissue: Secondary | ICD-10-CM | POA: Insufficient documentation

## 2015-07-30 DIAGNOSIS — Z170 Estrogen receptor positive status [ER+]: Secondary | ICD-10-CM | POA: Insufficient documentation

## 2015-07-30 DIAGNOSIS — Z1231 Encounter for screening mammogram for malignant neoplasm of breast: Secondary | ICD-10-CM

## 2015-07-30 DIAGNOSIS — Z79899 Other long term (current) drug therapy: Secondary | ICD-10-CM | POA: Diagnosis not present

## 2015-07-30 DIAGNOSIS — R918 Other nonspecific abnormal finding of lung field: Secondary | ICD-10-CM | POA: Diagnosis not present

## 2015-07-30 DIAGNOSIS — Z9223 Personal history of estrogen therapy: Secondary | ICD-10-CM | POA: Insufficient documentation

## 2015-07-30 NOTE — Progress Notes (Signed)
[place] @ [facility] Telephone:(336) [phone]  Fax:(336) [place]: 1951/02/09  MR#: [id number]  FAO#:[id number]  Patient Care Team: Ezequiel Kayser, MD as PCP - General (Internal Medicine)  CHIEF COMPLAINT:  Chief Complaint  Patient presents with  . 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 Estrogen receptor greater than 90%. Progesterone receptor greater than 90%.    Previous therapy:   1. Right mastectomy and axillary lymph node dissection.  2. Adriamycin and Cytoxan.  3. Tamoxifen 20 mg daily  4. Vaginal bleeding on Tamoxifen in November 2004.   5. Aromasin 25 mg daily starting January 2005. 6. Finished aromosin   was seen in November of 2007, and has refused NSABP B. 42 study 7. Left thigh sarcoma, 2009     Primary cancer ([place])   07/06/2011 Initial Diagnosis Primary cancer    Oncology Flowsheet 05/08/2015  dexamethasone (DECADRON) IJ -  ondansetron (ZOFRAN) IJ -    INTERVAL HISTORY: 65 year old lady with a history of carcinoma of breast status post right mastectomy.  Status post chemotherapy and entire hormone therapy Patient also had a history of sarcoma of the thigh (left) resected radiated at [facility].  Now being followed because a pulmonary nodule. Her shortness of breath. Occasionally has a chest pain on the left side Here for further follow-up and treatment consideration 's last evaluation patient did not have any other significant symptoms. Getting regular checkup done by Dr. Wellington Hampshire regarding sarcoma Patient recently had multiple surgery done on the right toe for benign bone cyst Pathology report has been reviewed and was negative for any malignancy.  REVIEW OF SYSTEMS:   Gen. Status: No chills or fever. HEENT: No headache.  No hearing loss.  No ear pain.  No nosebleed or congestion.  No sore throat.  No difficulty swallowing Respiratory system: No  cough.  No hemoptysis.  No shortness of breath at rest or exertion.  No chest pain. Cardiovascular system: No chest pain.  No palpitation.  No paroxysmal nocturnal dyspnea. Gastro intestinal system: No heartburn.  No nausea or vomiting.  No abdominal pain.  No diarrhea.  No constipation.  No rectal bleeding. Neurological system: No dizziness.  No tingling.  His was.  no tingling numbness.  no focal weakness or any focal signs. Skin: No evidence of ecchymosis or rash. As per HPI. Otherwise, a complete review of systems is negatve. Significant History/PMH:   hyperthyroidism:    lymphedema:    Breast Cancer:    Chondrosarcoma Left Buttock:    mastectomy:   Preventive Screening:  Has patient had any of the following test? Mammography (1)   Last Mammography: 09/17/2010(1)   Smoking History: Smoking History Never Smoked.(1)  PFSH: Additional Past Medical and Surgical History: Past Medical History   Arthritis    Hemorrhoids    Past Surgical History   Status post right salpingo-oophorectomy in 11/90 for a benign cyst.    Family History   No family history of colorectal cancer, breast cancer or ovarian cancer.       Social History   Currently works as an Optometrist. Nonsmoker, nondrinker. No radiation exposure. No recreational drug use. Positive for second-hand smoke.    ADVANCED DIRECTIVES:  Patient does have advanced health care directive HEALTH MAINTENANCE: Social History  Substance Use Topics  . Smoking status: Never Smoker   . Smokeless tobacco: Never Used  . Alcohol Use: No  No Known Allergies  Current Outpatient Prescriptions  Medication Sig Dispense Refill  . Calcium Carbonate-Vitamin D (CALCIUM + D PO) CALCITRATE/VITAMIN D 315-200 MG-UNIT TABSGeneric: CALCIUM CITRATE-VITAMIN Dtwo by mouth every morning and one by mouth every evening    . cyclobenzaprine (FLEXERIL) 5 MG tablet Take 5 mg by mouth.    . enoxaparin (LOVENOX) 30 MG/0.3ML injection Inject  0.3 mLs (30 mg total) into the skin every 12 (twelve) hours. 5 Syringe 1  . ferrous sulfate 325 (65 FE) MG tablet Take by mouth.    . hydrochlorothiazide (HYDRODIURIL) 25 MG tablet 25 mg.    . levothyroxine (SYNTHROID, LEVOTHROID) 125 MCG tablet Take 125 mcg by mouth daily before breakfast.    . Magnesium Gluconate 27.5 MG TABS Take 250 mg by mouth.    Marland Kitchen omeprazole (PRILOSEC) 20 MG capsule Take 20 mg by mouth.    . simvastatin (ZOCOR) 20 MG tablet Take 20 mg by mouth.     No current facility-administered medications for this visit.    OBJECTIVE:  Filed Vitals:   07/30/15 0955  BP: 137/88  Pulse: 81  Temp: 96.5 F (35.8 C)  Resp: 18     Body mass index is 39.85 kg/(m^2).    ECOG FS:0 - Asymptomatic  PHYSICAL EXAM: General  status: Performance status is good.  Patient has not lost significant weight HEENT: No evidence of stomatitis. Sclera and conjunctivae :: No jaundice.   pale looking. Lungs: Air  entry equal on both sides.  No rhonchi.  No rales.  Cardiac: Heart sounds are normal.  No pericardial rub.  No murmur. Lymphatic system: Cervical, axillary, inguinal, lymph nodes not palpable GI: Abdomen is soft.  No ascites.  Liver spleen not palpable.  No tenderness.  Bowel sounds are within normal limit Lower extremity: No edema Neurological system: Higher functions, cranial nerves intact no evidence of peripheral neuropathy. Skin: No rash.  No ecchymosis.. Right chest wall area there is no evidence of recurrent disease.  Axillary lymph nodes not palpable.  Left breast free of masses   LAB RESULTS:  No visits with results within 2 Day(s) from this visit. Latest known visit with results is:  Admission on 05/08/2015, Discharged on 05/08/2015  Component Date Value Ref Range Status  . SURGICAL PATHOLOGY 05/08/2015    Final                   Value:Surgical Pathology CASE: [id number] PATIENT: [facility] Surgical Pathology Report     SPECIMEN SUBMITTED: A. Bone cyst,  right great toe  CLINICAL HISTORY: None provided  PRE-OPERATIVE DIAGNOSIS: Q66.21 acquired hallux valgus with metatarsus primus varus of right foot  POST-OPERATIVE DIAGNOSIS: Acquired hallux valgus with metatarsus primus varus of right foot     DIAGNOSIS: A. BONE CYST, RIGHT GREAT TOE; LAPIPLASTY: - BONE AND ARTICULAR CARTILAGE WITH FEATURES OF DEGENERATIVE OSTEOARTHRITIS.   GROSS DESCRIPTION:  A. Labeled: right foot bone cyst  Tissue fragment(s): 2  Size: aggregate 3.5 x 2.8 x 0.2 cm  Description: pink to tan bone fragments  Entirely submitted in 1-2 cassette(s) following decalcification.    Final Diagnosis performed by Delorse Lek, MD.  Electronically signed 05/12/2015 10:43:23AM    The electronic signature indicates that the named Attending Pathologist has evaluated the specimen  Technical component performed                          at [facility], [address] [place], [place] [address] Lab: [phone] Dir:  Darrick Penna. Evette Doffing, MD  Professional component performed at [facility], [facility], [place], [place], [place] [address] Lab: [phone] Dir: Dellia Nims. Rubinas, MD         ASSESSMENT: Carcinoma ofright breast.  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 She also understands that She can call clinic at any time with any questions, concerns, or complaints.    No matching staging information was found for the patient.  Forest Gleason, MD   07/30/2015 10:19 AM

## 2015-08-03 ENCOUNTER — Encounter: Payer: Self-pay | Admitting: Oncology

## 2015-08-26 ENCOUNTER — Ambulatory Visit: Payer: 59

## 2015-09-01 ENCOUNTER — Ambulatory Visit
Admission: RE | Admit: 2015-09-01 | Discharge: 2015-09-01 | Disposition: A | Discharge status: Home / Self Care | Payer: 59 | Source: Ambulatory Visit | Attending: Oncology | Admitting: Oncology

## 2015-09-01 DIAGNOSIS — Z1231 Encounter for screening mammogram for malignant neoplasm of breast: Secondary | ICD-10-CM | POA: Insufficient documentation

## 2015-09-01 DIAGNOSIS — R928 Other abnormal and inconclusive findings on diagnostic imaging of breast: Secondary | ICD-10-CM | POA: Insufficient documentation

## 2015-09-01 DIAGNOSIS — Z853 Personal history of malignant neoplasm of breast: Secondary | ICD-10-CM | POA: Diagnosis not present

## 2015-09-01 HISTORY — DX: Malignant neoplasm of unspecified site of unspecified female breast: C50.919

## 2015-09-03 ENCOUNTER — Encounter: Payer: Self-pay | Admitting: General Surgery

## 2015-09-03 ENCOUNTER — Ambulatory Visit: Payer: Self-pay

## 2015-09-03 ENCOUNTER — Other Ambulatory Visit: Payer: Self-pay | Admitting: Internal Medicine

## 2015-09-03 ENCOUNTER — Ambulatory Visit (INDEPENDENT_AMBULATORY_CARE_PROVIDER_SITE_OTHER): Payer: 59 | Admitting: General Surgery

## 2015-09-03 VITALS — BP 130/82 | HR 82 | Resp 14 | Ht 69.0 in | Wt 278.0 lb

## 2015-09-03 DIAGNOSIS — N6009 Solitary cyst of unspecified breast: Secondary | ICD-10-CM | POA: Insufficient documentation

## 2015-09-03 DIAGNOSIS — N6320 Unspecified lump in the left breast, unspecified quadrant: Secondary | ICD-10-CM

## 2015-09-03 DIAGNOSIS — N6002 Solitary cyst of left breast: Secondary | ICD-10-CM

## 2015-09-03 DIAGNOSIS — N63 Unspecified lump in breast: Secondary | ICD-10-CM | POA: Diagnosis not present

## 2015-09-03 NOTE — Patient Instructions (Signed)
The patient is aware to call back for any questions or concerns.  

## 2015-09-03 NOTE — Progress Notes (Signed)
Patient ID: Danielle Hansen, female   DOB: 11/27/1950, 65 y.o.   MRN: [id number]  Chief Complaint  Patient presents with  . Breast Problem    abnormal mammogram    HPI Danielle Hansen is a 65 y.o. female.  who presents for a breast evaluation. The most recent left mammogram and was on 09-01-15.  Last year 08-27-15 she had an left mammogram and ultrasound for left breast cyst. She has a history of right breast cancer with mastectomy in 2002 by Dr Pat Patrick. She can not feel anything different in the breast. Patient does perform regular self breast checks and gets regular mammograms done.   She works for [employer] in [place].  Ongoing follow-up with medical oncology will be changed from Dr. Oliva Bustard to Dr.Brahmanday this year.   The patient underwent resection of a left thigh sarcoma in 2011. Local recurrence followed by reexcision and radiation therapy.  HPI  Past Medical History  Diagnosis Date  . GERD (gastroesophageal reflux disease)   . Lung nodule     Dr Kendall Flack  . Hypothyroid   . Uterine fibroid   . Arthritis   . Degenerative disc disease, cervical     no limitations  . Hypertension   . Cancer ([place]) 09/09/00    rt mastectomy-09/09/00  . Cancer [facility]) 2009/2011    had 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 /[place]/ Dr Leonides Schanz  . 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 Breast cancer ([place]) 2002    rt mastectomy/chemo    Past Surgical History  Procedure Laterality Date  . Mastectomy Right 09/09/00    09/09/00-Dr Pat Patrick mastectomy/chemo  . Oophorectomy Right 1990    right tube and ovary removed  . Salpingectomy Right 1990  . Knee arthroscopy Right 05/03  . Soft tissue tumor resection Left 7/09, 6/11    gluteal  . Hallux valgus lapidus Right 05/08/2015    Procedure: LAPIPLASTY 1ST METATARSAL GREAT TOE;  Surgeon: Albertine Patricia, DPM;  Location: [place];  Service: Podiatry;  Laterality: Right;  . Bunionectomy Right 2017  . Colonoscopy  2014    Verdie Shire, MD     Family History  Problem Relation Age of Onset  . Breast cancer Sister 12    2004, 2011  . Heart disease Sister   . Heart disease Brother   . Diabetes Father   . Hypertension Father   . Uterine cancer Sister 72    Social History Social History  Substance Use Topics  . Smoking status: Never Smoker   . Smokeless tobacco: Never Used  . Alcohol Use: No    No Known Allergies  Current Outpatient Prescriptions  Medication Sig Dispense Refill  . acetaminophen (TYLENOL) 325 MG tablet Take 650 mg by mouth every 6 (six) hours as needed.    . Calcium Carbonate-Vitamin D (CALCIUM + D PO) CALCITRATE/VITAMIN D 315-200 MG-UNIT TABSGeneric: CALCIUM CITRATE-VITAMIN Dtwo by mouth every morning and one by mouth every evening    . cyclobenzaprine (FLEXERIL) 5 MG tablet Take 5 mg by mouth.    . ferrous sulfate 325 (65 FE) MG tablet Take by mouth.    . hydrochlorothiazide (HYDRODIURIL) 25 MG tablet 25 mg.    . levothyroxine (SYNTHROID, LEVOTHROID) 125 MCG tablet Take 125 mcg by mouth daily before breakfast.    . Magnesium Gluconate 27.5 MG TABS Take 250 mg by mouth.    Marland Kitchen omeprazole (PRILOSEC) 20 MG capsule Take 20 mg by mouth.    . simvastatin (ZOCOR) 20  MG tablet Take 20 mg by mouth.     No current facility-administered medications for this visit.    Review of Systems Review of Systems  Constitutional: Negative.   Respiratory: Negative.   Cardiovascular: Negative.     Blood pressure 130/82, pulse 82, resp. rate 14, height 5\' 9"  (1.753 m), weight 278 lb (126.1 kg).  Physical Exam Physical Exam  Constitutional: She is oriented to person, place, and time. She appears well-developed and well-nourished.  HENT:  Mouth/Throat: Oropharynx is clear and moist.  Eyes: Conjunctivae are normal. No scleral icterus.  Neck: Neck supple.  Cardiovascular: Normal rate, regular rhythm and normal heart sounds.   Pulmonary/Chest: Effort normal and breath sounds normal. Left breast exhibits no inverted  nipple, no mass, no nipple discharge, no skin change and no tenderness.    Right mastectomy site clear  Lymphadenopathy:    She has no cervical adenopathy.    She has no axillary adenopathy.  Neurological: She is alert and oriented to person, place, and time.  Skin: Skin is warm and dry.  Psychiatric: Her behavior is normal.    Data Reviewed 2016 and 2017 mammograms reviewed and see screening) sees. Enlargement of the isolated area in the upper-outer quadrant of the left breast.  08/27/2014 ultrasound suggested 2 simple cyst adjacent one another BI-RADS 2.  Ultrasound examination of the left breast was undertaken at the area of previous concern. Here a multilobulated lesion was identified with a significant increase in size to 0.56 x 0.69 x 0.747 m. Posterior acoustic enhancement was identified. Questionable slight irregularity of the cyst wall medially appreciated. BI-RADS-3.  In light of the patient's previous cancer was elected to proceed to aspiration. 1 mL of 1% plain Xylocaine was utilized. The area was aspirated with complete resolution. A small bottom of clear fluid was noted. This was discarded. The procedure was well tolerated.      Assessment    Left breast cyst, resolved on aspiration.    Plan    The patient's breast exam is benign. She was advised that she may receive a call after her screening mammogram from 09/01/2015 has been reviewed for follow-up. As the cyst has been resolved with aspiration and this would not be necessary.       PCP:  Ezequiel Kayser  Ref Dr Oliva Bustard This information has been scribed by Karie Fetch RN, BSN,BC.    Robert Bellow 09/03/2015, 9:29 AM

## 2015-11-22 DIAGNOSIS — E538 Deficiency of other specified B group vitamins: Secondary | ICD-10-CM | POA: Insufficient documentation

## 2016-02-18 ENCOUNTER — Ambulatory Visit (INDEPENDENT_AMBULATORY_CARE_PROVIDER_SITE_OTHER): Payer: 59 | Admitting: Obstetrics and Gynecology

## 2016-02-18 ENCOUNTER — Encounter: Payer: Self-pay | Admitting: Obstetrics and Gynecology

## 2016-02-18 VITALS — BP 110/65 | HR 80 | Ht 69.0 in | Wt 284.7 lb

## 2016-02-18 DIAGNOSIS — Z01419 Encounter for gynecological examination (general) (routine) without abnormal findings: Secondary | ICD-10-CM

## 2016-02-18 NOTE — Progress Notes (Signed)
Subjective:   Danielle Hansen is a 65 y.o. G69P1 Caucasian female here for a routine well-woman exam.  No LMP recorded. Patient is not currently having periods (Reason: Chemotherapy).    Current complaints: none PCP: Th[facility]       doesn't desire labs  Social History: Sexual: heterosexual Marital Status: married Living situation: with spouse Occupation: retired Tobacco/alcohol: no tobacco use Illicit drugs: no history of illicit drug use  The following portions of the patient's history were reviewed and updated as appropriate: allergies, current medications, past family history, past medical history, past social history, past surgical history and problem list.  Past Medical History Past Medical History:  Diagnosis Date  . Arthritis   . Breast cancer ([place]) 2002   rt mastectomy/chemo  . Cancer ([place]) 09/09/00   rt mastectomy-09/09/00  . 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 Degenerative disc disease, cervical    no limitations  . GERD (gastroesophageal reflux disease)   . Hypertension   . Hypothyroid   . Lung nodule    Dr Kendall Flack  . 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 Uterine fibroid     Past Surgical History Past Surgical History:  Procedure Laterality Date  . BUNIONECTOMY Right 2017  . COLONOSCOPY  2014   Verdie Shire, [place] Right 05/08/2015   Procedure: LAPIPLASTY 1ST METATARSAL GREAT TOE;  Surgeon: Albertine Patricia, DPM;  Location: [place];  Service: Podiatry;  Laterality: Right;  . KNEE ARTHROSCOPY Right 05/03  . MASTECTOMY Right 09/09/00   09/09/00-Dr Pat Patrick mastectomy/chemo  . OOPHORECTOMY Right 1990   right tube and ovary removed  . SALPINGECTOMY Right 1990  . SOFT TISSUE TUMOR RESECTION Left 7/09, 6/11   gluteal    Gynecologic History G1P1  No LMP recorded. Patient is not currently having periods (Reason: Chemotherapy). Contraception: post menopausal status Last Pap:  ?Marland Kitchen Results were: normal Last mammogram: 2017. Results were: normal   Obstetric History OB History  Gravida Para Term Preterm AB Living  1 1          SAB TAB Ectopic Multiple Live Births               # Outcome Date GA Lbr Len/2nd Weight Sex Delivery Anes PTL Lv  1 Para             Obstetric Comments  1st Menstrual Cycle:  13  1st Pregnancy:  25    Current Medications Current Outpatient Prescriptions on File Prior to Visit  Medication Sig Dispense Refill  . acetaminophen (TYLENOL) 325 MG tablet Take 650 mg by mouth every 6 (six) hours as needed.    . Calcium Carbonate-Vitamin D (CALCIUM + D PO) CALCITRATE/VITAMIN D 315-200 MG-UNIT TABSGeneric: CALCIUM CITRATE-VITAMIN Dtwo by mouth every morning and one by mouth every evening    . cyclobenzaprine (FLEXERIL) 5 MG tablet Take 5 mg by mouth.    . hydrochlorothiazide (HYDRODIURIL) 25 MG tablet 25 mg.    . levothyroxine (SYNTHROID, LEVOTHROID) 125 MCG tablet Take 125 mcg by mouth daily before breakfast.    . Magnesium Gluconate 27.5 MG TABS Take 250 mg by mouth.    Marland Kitchen omeprazole (PRILOSEC) 20 MG capsule Take 20 mg by mouth.    . simvastatin (ZOCOR) 20 MG tablet Take 20 mg by mouth.    . ferrous sulfate 325 (65 FE) MG tablet Take by mouth.     No current  facility-administered medications on file prior to visit.     Review of Systems Patient denies any headaches, blurred vision, shortness of breath, chest pain, abdominal pain, problems with bowel movements, urination, or intercourse.  Objective:  BP 110/65   Pulse 80   Ht 5\' 9"  (1.753 m)   Wt 284 lb 11.2 oz (129.1 kg)   BMI 42.04 kg/m  Physical Exam  General:  Well developed, well nourished, no acute distress. She is alert and oriented x3. Skin:  Warm and dry Neck:  Midline trachea, no thyromegaly or nodules Cardiovascular: Regular rate and rhythm, no murmur heard Lungs:  Effort normal, all lung fields clear to auscultation bilaterally Breasts:  No dominant palpable mass,  retraction, or nipple discharge. S/P right breast mastectomy. Abdomen:  Soft, non tender, no hepatosplenomegaly or masses Pelvic:  External genitalia is normal in appearance.  The vagina is normal in appearance. The cervix is bulbous, no CMT.  Thin prep pap is not done. Uterus is felt to be normal size, shape, and contour.  No adnexal masses or tenderness noted. Extremities:  No swelling or varicosities noted Psych:  She has a normal mood and affect  Assessment:   Healthy well-woman exam  Plan:   F/U 1 year for AE, or sooner if needed Mammogram done already  Verbie Babic Rockney Ghee, CNM

## 2016-04-05 IMAGING — MG MM DIAG BREAST TOMO UNI LEFT
6 series · 6 of 14 positions shown · non-contrast
Comparison: Prior exams

CLINICAL DATA: Call back from screening for a possible left breast
mass. Patient with a personal history of right breast carcinoma
treated with mastectomy.

EXAM:
DIGITAL DIAGNOSTIC LEFT MAMMOGRAM WITH 3D TOMOSYNTHESIS WITH CAD
ULTRASOUND LEFT BREAST

[L MLO synth-2D]
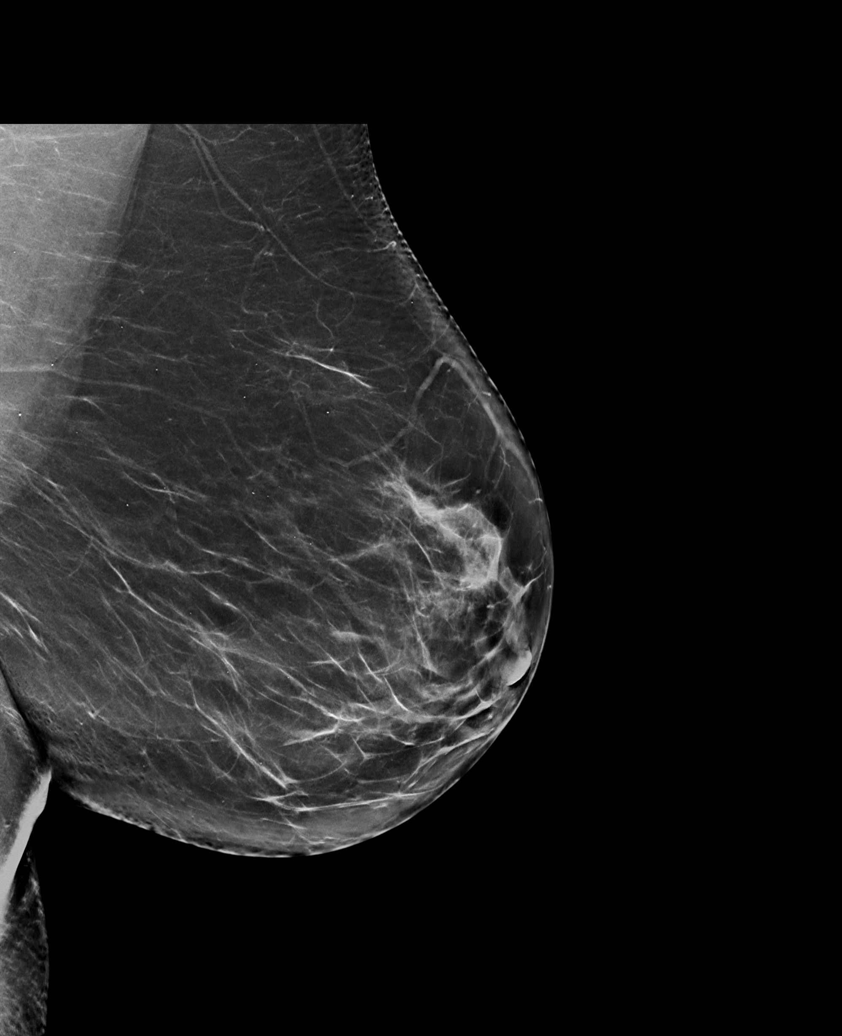

[L CC synth-2D]
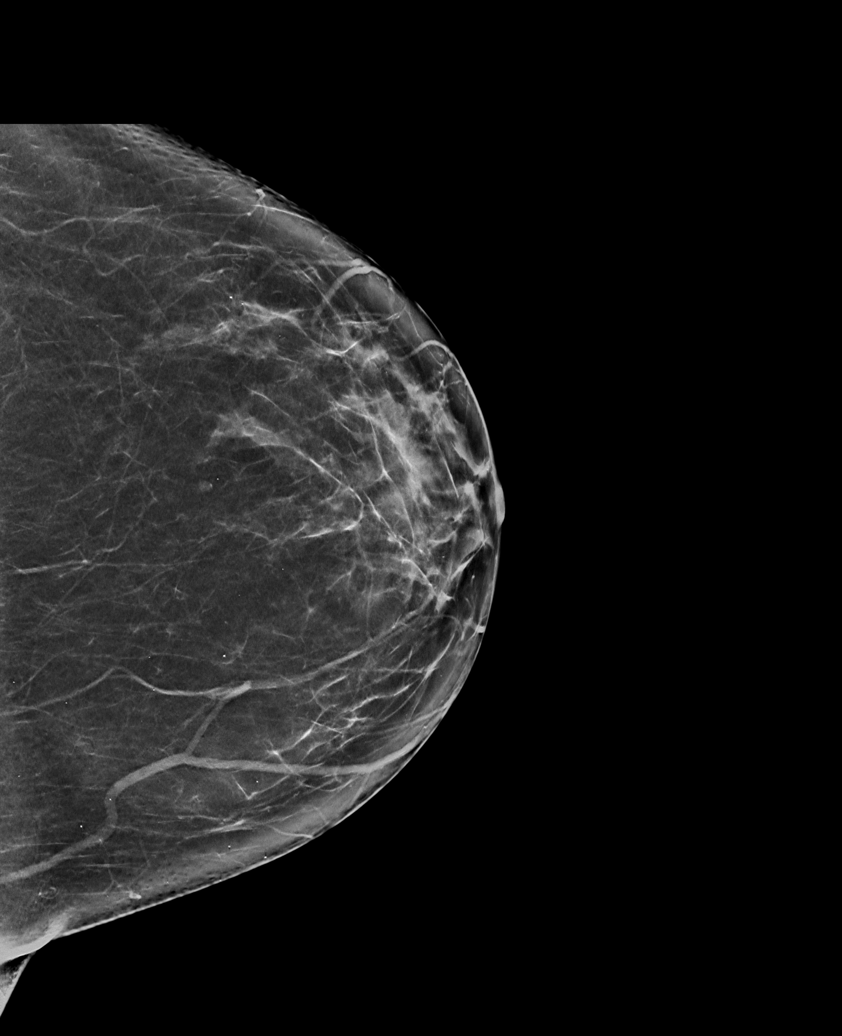

[L CC]
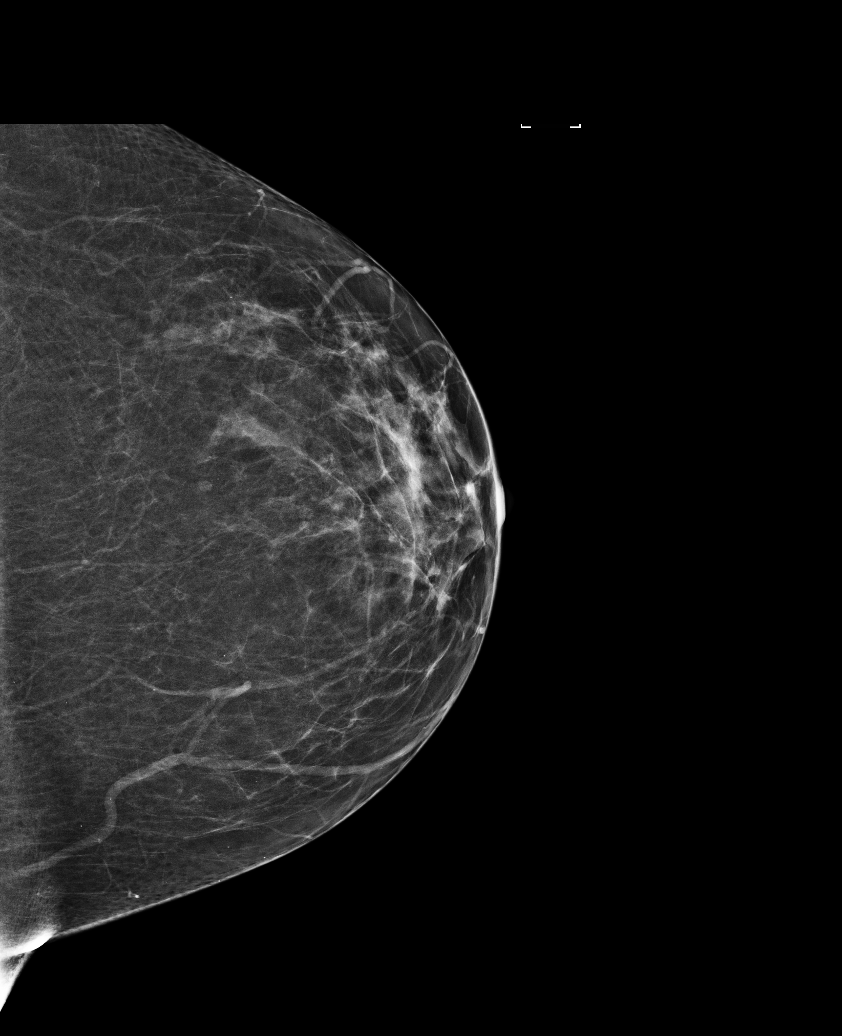

[L MLO]
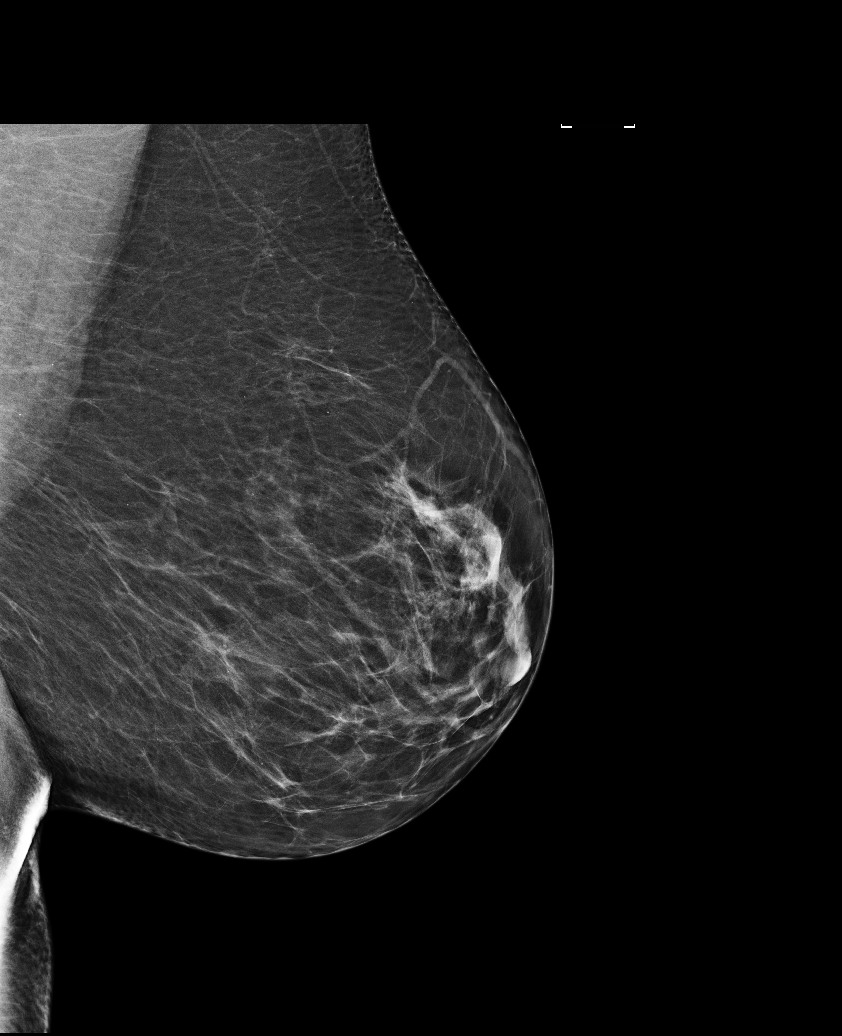

[L MLO tomo · tomo slice 43/86.0]
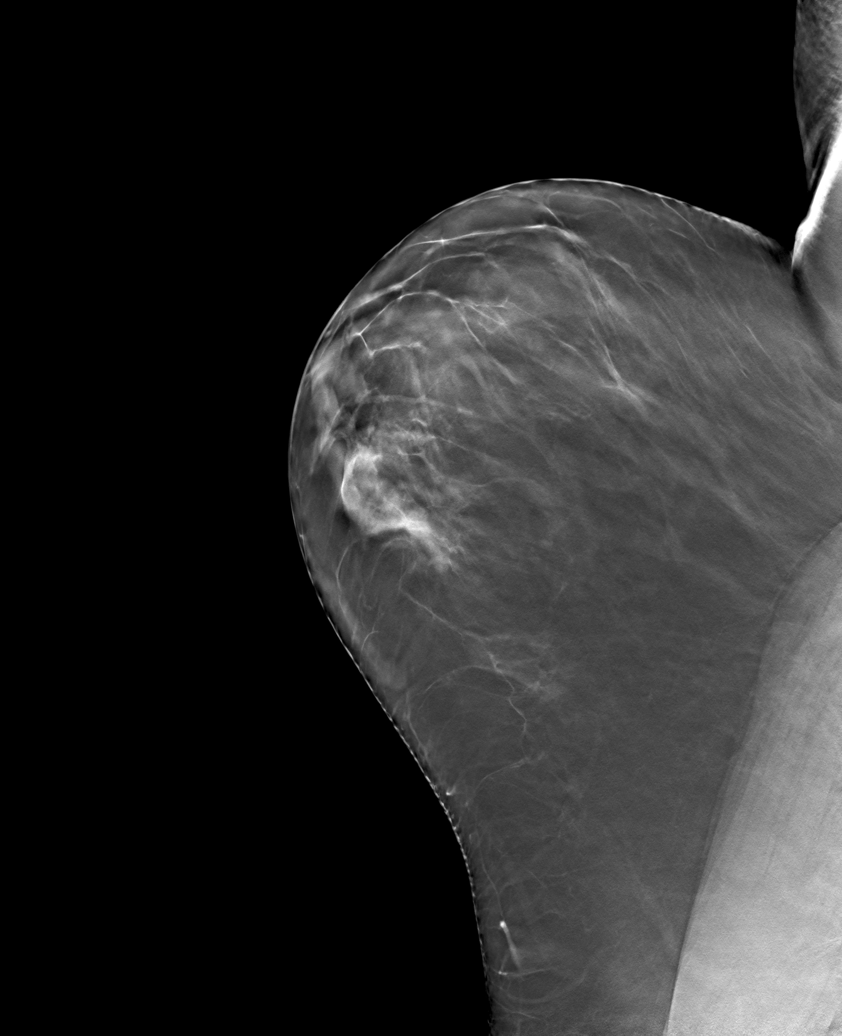

[L CC tomo · tomo slice 37/74.0]
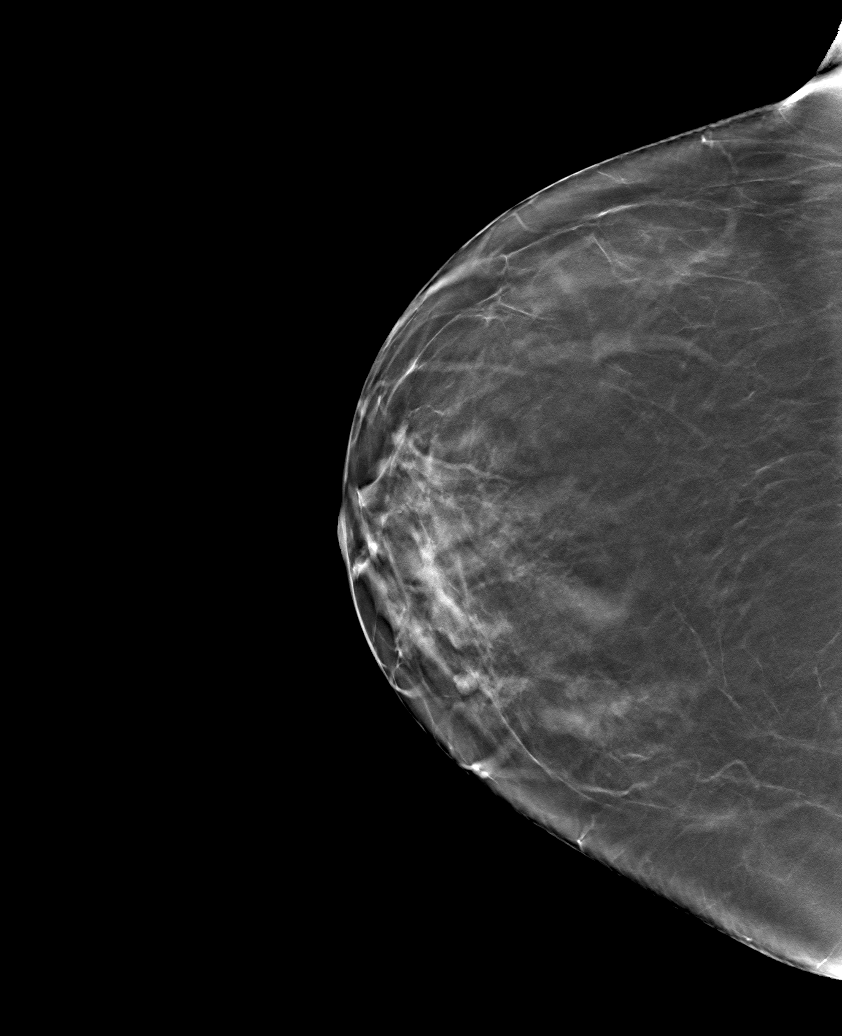

[6 of 14 positions shown; findings below may reference images not displayed]

ACR Breast Density Category b: There are scattered areas of
fibroglandular density.
FINDINGS: There are 2 small closely approximated round circumscribed
homogeneous masses in the lateral left breast, anterior [DATE]. There
is no architectural distortion. There are no suspicious
calcifications. No other discrete masses.

Mammographic images were processed with CAD.

Targeted ultrasound is performed, showing 2 small closely
approximated cysts. The larger of the 2 measures 5 mm in greatest
dimension. Both lie in the 2 o'clock position, 3 cm from the nipple.
There are no complicating features. These to small cyst corresponds
in size, shape and location to the mammographic finding.
IMPRESSION: Benign left breast cysts.  No evidence of malignancy.

RECOMMENDATION:
Screening mammogram in one year.(Code:OB-R-3UO)

I have discussed the findings and recommendations with the patient.
Results were also provided in writing at the conclusion of the
visit. If applicable, a reminder letter will be sent to the patient
regarding the next appointment.

BI-RADS CATEGORY  2: Benign.

## 2016-04-23 ENCOUNTER — Telehealth: Payer: Self-pay | Admitting: *Deleted

## 2016-04-23 NOTE — Telephone Encounter (Addendum)
Pt returned call and stated needs breast prosthesis and #4 mastectomy bras. Pt would like order to be sent to [facility] in [place]. Fax # [phone]. Order has been signed and faxed.

## 2016-04-23 NOTE — Telephone Encounter (Signed)
Pt called to request prescription for breast prosthesis and mastectomy bras. Called pt back to get further details. Awaiting call back from patient.

## 2016-08-03 ENCOUNTER — Other Ambulatory Visit: Payer: 59

## 2016-08-03 ENCOUNTER — Ambulatory Visit: Payer: 59 | Admitting: Internal Medicine

## 2016-08-19 ENCOUNTER — Other Ambulatory Visit: Payer: Self-pay | Admitting: *Deleted

## 2016-08-19 DIAGNOSIS — N6320 Unspecified lump in the left breast, unspecified quadrant: Secondary | ICD-10-CM

## 2016-08-24 ENCOUNTER — Inpatient Hospital Stay: Payer: 59 | Attending: Internal Medicine | Admitting: Internal Medicine

## 2016-08-24 ENCOUNTER — Inpatient Hospital Stay: Payer: 59

## 2016-08-24 ENCOUNTER — Encounter: Payer: Self-pay | Admitting: Internal Medicine

## 2016-08-24 ENCOUNTER — Other Ambulatory Visit: Payer: 59

## 2016-08-24 DIAGNOSIS — Z923 Personal history of irradiation: Secondary | ICD-10-CM | POA: Insufficient documentation

## 2016-08-24 DIAGNOSIS — R198 Other specified symptoms and signs involving the digestive system and abdomen: Secondary | ICD-10-CM

## 2016-08-24 DIAGNOSIS — Z170 Estrogen receptor positive status [ER+]: Secondary | ICD-10-CM | POA: Diagnosis not present

## 2016-08-24 DIAGNOSIS — R600 Localized edema: Secondary | ICD-10-CM | POA: Insufficient documentation

## 2016-08-24 DIAGNOSIS — R928 Other abnormal and inconclusive findings on diagnostic imaging of breast: Secondary | ICD-10-CM | POA: Insufficient documentation

## 2016-08-24 DIAGNOSIS — Z803 Family history of malignant neoplasm of breast: Secondary | ICD-10-CM | POA: Insufficient documentation

## 2016-08-24 DIAGNOSIS — I10 Essential (primary) hypertension: Secondary | ICD-10-CM | POA: Diagnosis not present

## 2016-08-24 DIAGNOSIS — K219 Gastro-esophageal reflux disease without esophagitis: Secondary | ICD-10-CM | POA: Diagnosis not present

## 2016-08-24 DIAGNOSIS — Z9223 Personal history of estrogen therapy: Secondary | ICD-10-CM | POA: Insufficient documentation

## 2016-08-24 DIAGNOSIS — Z9221 Personal history of antineoplastic chemotherapy: Secondary | ICD-10-CM | POA: Insufficient documentation

## 2016-08-24 DIAGNOSIS — Z808 Family history of malignant neoplasm of other organs or systems: Secondary | ICD-10-CM | POA: Insufficient documentation

## 2016-08-24 DIAGNOSIS — E039 Hypothyroidism, unspecified: Secondary | ICD-10-CM | POA: Insufficient documentation

## 2016-08-24 DIAGNOSIS — Z79899 Other long term (current) drug therapy: Secondary | ICD-10-CM

## 2016-08-24 DIAGNOSIS — R918 Other nonspecific abnormal finding of lung field: Secondary | ICD-10-CM | POA: Diagnosis not present

## 2016-08-24 DIAGNOSIS — Z9011 Acquired absence of right breast and nipple: Secondary | ICD-10-CM | POA: Insufficient documentation

## 2016-08-24 DIAGNOSIS — Z853 Personal history of malignant neoplasm of breast: Secondary | ICD-10-CM | POA: Insufficient documentation

## 2016-08-24 DIAGNOSIS — N6320 Unspecified lump in the left breast, unspecified quadrant: Secondary | ICD-10-CM

## 2016-08-24 DIAGNOSIS — C419 Malignant neoplasm of bone and articular cartilage, unspecified: Secondary | ICD-10-CM

## 2016-08-24 DIAGNOSIS — Z85831 Personal history of malignant neoplasm of soft tissue: Secondary | ICD-10-CM | POA: Diagnosis not present

## 2016-08-24 DIAGNOSIS — C50811 Malignant neoplasm of overlapping sites of right female breast: Secondary | ICD-10-CM | POA: Insufficient documentation

## 2016-08-24 LAB — COMPREHENSIVE METABOLIC PANEL
ALT: 14 U/L (ref 14–54)
AST: 17 U/L (ref 15–41)
Albumin: 4.2 g/dL (ref 3.5–5.0)
Alkaline Phosphatase: 89 U/L (ref 38–126)
Anion gap: 8 (ref 5–15)
BUN: 20 mg/dL (ref 6–20)
CO2: 31 mmol/L (ref 22–32)
Calcium: 9.8 mg/dL (ref 8.9–10.3)
Chloride: 99 mmol/L — ABNORMAL LOW (ref 101–111)
Creatinine, Ser: 0.79 mg/dL (ref 0.44–1.00)
GFR calc Af Amer: >60 mL/min (ref >60)
GFR calc non Af Amer: >60 mL/min (ref >60)
Glucose, Bld: 175 mg/dL — ABNORMAL HIGH (ref 65–99)
Potassium: 4.2 mmol/L (ref 3.5–5.1)
Sodium: 138 mmol/L (ref 135–145)
TOTAL PROTEIN: 7.7 g/dL (ref 6.5–8.1)
Total Bilirubin: 0.6 mg/dL (ref 0.3–1.2)

## 2016-08-24 LAB — CBC WITH DIFFERENTIAL/PLATELET
BASOS ABS: 0 10*3/uL (ref 0–0.1)
Basophils Relative: 1 %
Eosinophils Absolute: 0.1 10*3/uL (ref 0–0.7)
Eosinophils Relative: 1 %
HEMATOCRIT: 39.1 % (ref 35.0–47.0)
Hemoglobin: 13 g/dL (ref 12.0–16.0)
LYMPHS ABS: 1.2 10*3/uL (ref 1.0–3.6)
Lymphocytes Relative: 20 %
MCH: 26.8 pg (ref 26.0–34.0)
MCHC: 33.3 g/dL (ref 32.0–36.0)
MCV: 80.6 fL (ref 80.0–100.0)
MONO ABS: 0.3 10*3/uL (ref 0.2–0.9)
MONOS PCT: 6 %
NEUTROS ABS: 4.5 10*3/uL (ref 1.4–6.5)
Neutrophils Relative %: 72 %
Platelets: 247 10*3/uL (ref 150–440)
RBC: 4.85 MIL/uL (ref 3.80–5.20)
RDW: 14.8 % — AB (ref 11.5–14.5)
WBC: 6.1 10*3/uL (ref 3.6–11.0)

## 2016-08-24 NOTE — Assessment & Plan Note (Addendum)
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 Recent MRI December 2017-NED. Continue surveillance as per [facility].  # Bilateral lung nodules- 1.3-1.7 centimeters in size [most recent x-ray April 2018]; followed by Dr. Judee Clara. Awaiting a CT scan in August 2018- no biopsy done so far.   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  # Abnormal mammogram left side- July 2017- cyst aspirated by Dr. Tollie Pizza. Recommend repeat mammogram in July 2018. This has been ordered.   # Follow-up in one year; labs.   # # 25 minutes face-to-face with the patient discussing the above plan of care; more than 50% of time spent on prognosis/ natural history; counseling and coordination. Reviewed the records from [facility] in detail./Summarized as above.

## 2016-08-24 NOTE — Progress Notes (Signed)
[place] OFFICE PROGRESS NOTE  Patient Care Team: Ezequiel Kayser, MD as PCP - General (Internal Medicine) [place] Gleason, MD (Oncology) Bary Castilla [place] Gleason, MD (General Surgery)  Cancer Staging No matching staging information was found for the patient.   Oncology History   Chief Complaint/Problem List  Breast cancer: T2N0M0, stage II. HER-2/neu 3 +. Estrogen receptor greater than 90%. Progesterone receptor greater than 90%.    Previous therapy:   1. Right mastectomy and axillary lymph node dissection- s/p. Adriamycin and Cytoxan.  3. Tamoxifen 20 mg daily  4. Vaginal bleeding on Tamoxifen in November 2004.   5. Aromasin 25 mg daily starting January 2005. 6. Finished aromosin   was seen in November of 2007, and has refused NSABP B. 42 study  7. Left thigh sarcoma, 2009 [[facility]; s/p Sarcoma spl; Dr.Savage; [facility]; followed by local recurrence s/p RT; Dr.Crystal; no chemo]; last MRI dec 2017- NED   # 2015- Bil Lung nodules [1.3-1.7cm; CXR- April 2018; Dr.Savage]  -----------------------------------------------------  S/p resection ([facility]) July 2009. 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 Focal margin positivity.Three benign appearing lymph nodes.  Surveillance plan: MRI q 6 month through 2014, then annually.     Malignant neoplasm of skeletal system ([facility])    Carcinoma of overlapping sites of right breast in female, estrogen receptor positive ([place])     This is my first interaction with the patient as patient's primary oncologist has been Dr.Choksi. I reviewed the patient's prior charts/pertinent labs/imaging in detail; findings are summarized above.     INTERVAL HISTORY:  Danielle Hansen 66 y.o.  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  Patient denies any unusual shortness of breath or cough. Appetite is good. No weight loss. Denies any new lumps or bumps. No nausea no vomiting.  No headaches. Marland Kitchen  MRI DEC 2017-Postoperative changes related to the left gluteal myxoid chondrosarcoma with multiple foci of micrometallic susceptibility artifact. There is similar mild edema like signal and minimal nonmasslike enhancement along the surgical bed. No nodular or masslike enhancement. No enlarged lymph nodes. Chest x-ray April 2018- bilateral lung nodules up to 1.7 cm in size.    REVIEW OF SYSTEMS:  A complete 10 point review of system is done which is negative except mentioned above/history of present illness.   PAST MEDICAL HISTORY :  Past Medical History:  Diagnosis Date  . Arthritis   . Breast cancer ([place]) 2002   rt mastectomy/chemo  . Cancer ([place]) 09/09/00   rt mastectomy-09/09/00  . 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 Degenerative disc disease, cervical    no limitations  . GERD (gastroesophageal reflux disease)   . Hypertension   . Hypothyroid   . Lung nodule    Dr Kendall Flack  . 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 Uterine fibroid     PAST SURGICAL HISTORY :   Past Surgical History:  Procedure Laterality Date  . BUNIONECTOMY Right 2017  . COLONOSCOPY  2014   Verdie Shire, [place] Right 05/08/2015   Procedure: LAPIPLASTY 1ST METATARSAL GREAT TOE;  Surgeon: Albertine Patricia, DPM;  Location: [place];  Service: Podiatry;  Laterality: Right;  . KNEE ARTHROSCOPY Right 05/03  . MASTECTOMY Right 09/09/00   09/09/00-Dr  [place] mastectomy/chemo  . OOPHORECTOMY Right 1990   right tube and ovary removed  . SALPINGECTOMY Right 1990  . SOFT TISSUE TUMOR RESECTION Left 7/09, 6/11   gluteal    FAMILY HISTORY :   Family History  Problem Relation Age of Onset  . Breast cancer Sister 75       2004, 2011  . Heart disease  Sister   . Diabetes Father   . Hypertension Father   . Heart disease Brother   . Uterine cancer Sister 50    SOCIAL HISTORY:   Social History  Substance Use Topics  . Smoking status: Never Smoker  . Smokeless tobacco: Never Used  . Alcohol use No    ALLERGIES:  has No Known Allergies.  MEDICATIONS:  Current Outpatient Prescriptions  Medication Sig Dispense Refill  . acetaminophen (TYLENOL) 325 MG tablet Take 650 mg by mouth every 6 (six) hours as needed.    . Calcium Carbonate-Vitamin D (CALCIUM + D PO) CALCITRATE/VITAMIN D 315-200 MG-UNIT TABSGeneric: CALCIUM CITRATE-VITAMIN Dtwo by mouth every morning and one by mouth every evening    . cyclobenzaprine (FLEXERIL) 5 MG tablet Take 5 mg by mouth.    . hydrochlorothiazide (HYDRODIURIL) 25 MG tablet 25 mg.    . levothyroxine (SYNTHROID, LEVOTHROID) 125 MCG tablet Take 125 mcg by mouth daily before breakfast.    . Magnesium Gluconate 27.5 MG TABS Take 250 mg by mouth.    Marland Kitchen omeprazole (PRILOSEC) 20 MG capsule Take 20 mg by mouth.    . simvastatin (ZOCOR) 20 MG tablet Take 20 mg by mouth.    . vitamin B-12 (CYANOCOBALAMIN) 500 MCG tablet Take by mouth.     No current facility-administered medications for this visit.     PHYSICAL EXAMINATION: ECOG PERFORMANCE STATUS: 0 - Asymptomatic  BP 121/83 (BP Location: Left Arm, Patient Position: Sitting)   Pulse 84   Temp 97.7 F (36.5 C) (Tympanic)   Ht 5' 9"  (1.753 m)   Wt 285 lb 6.2 oz (129.5 kg)   BMI 42.14 kg/m   Filed Weights   08/24/16 0906  Weight: 285 lb 6.2 oz (129.5 kg)    GENERAL: Well-nourished well-developed; Alert, no distress and comfortable.   Alone. EYES: no pallor or icterus OROPHARYNX: no thrush or ulceration; good dentition  NECK: supple, no masses felt LYMPH:  no palpable lymphadenopathy in the cervical, axillary or inguinal regions LUNGS: clear to auscultation and  No wheeze or crackles HEART/CVS: regular rate & rhythm and no murmurs; No lower extremity  edema ABDOMEN:abdomen soft, non-tender and normal bowel sounds Musculoskeletal:no cyanosis of digits and no clubbing; left buttocks- surgical incision scar noted. No masses felt. PSYCH: alert & oriented x 3 with fluent speech NEURO: no focal motor/sensory deficits SKIN:  no rashes or significant lesions Right and left BREAST exam [in the presence of nurse]- no unusual skin changes or dominant masses felt. Surgical scars noted.    LABORATORY DATA:  I have reviewed the data as listed    Component Value Date/Time   NA 138 08/24/2016 0851   NA 137 08/03/2013 0838   K 4.2 08/24/2016 0851   K 3.4 (L) 08/03/2013 0838   CL 99 (L) 08/24/2016 0851   CL 98 08/03/2013 0838   CO2 31 08/24/2016 0851   CO2 32 08/03/2013 0838   GLUCOSE 175 (H) 08/24/2016 0851   GLUCOSE 121 (H) 08/03/2013 0838   BUN 20 08/24/2016 0851   BUN 12 08/03/2013 0838   CREATININE 0.79 08/24/2016 1610  CREATININE 0.90 08/03/2013 0838   CALCIUM 9.8 08/24/2016 0851   CALCIUM 9.5 08/03/2013 0838   PROT 7.7 08/24/2016 0851   PROT 7.9 08/03/2013 0838   ALBUMIN 4.2 08/24/2016 0851   ALBUMIN 3.8 08/03/2013 0838   AST 17 08/24/2016 0851   AST 18 08/03/2013 0838   ALT 14 08/24/2016 0851   ALT 23 08/03/2013 0838   ALKPHOS 89 08/24/2016 0851   ALKPHOS 115 08/03/2013 0838   BILITOT 0.6 08/24/2016 0851   BILITOT 0.4 08/03/2013 0838   GFRNONAA >60 08/24/2016 0851   GFRNONAA >60 08/03/2013 0838   GFRAA >60 08/24/2016 0851   GFRAA >60 08/03/2013 0838    No results found for: SPEP, UPEP  Lab Results  Component Value Date   WBC 6.1 08/24/2016   NEUTROABS 4.5 08/24/2016   HGB 13.0 08/24/2016   HCT 39.1 08/24/2016   MCV 80.6 08/24/2016   PLT 247 08/24/2016      Chemistry      Component Value Date/Time   NA 138 08/24/2016 0851   NA 137 08/03/2013 0838   K 4.2 08/24/2016 0851   K 3.4 (L) 08/03/2013 0838   CL 99 (L) 08/24/2016 0851   CL 98 08/03/2013 0838   CO2 31 08/24/2016 0851   CO2 32 08/03/2013 0838   BUN  20 08/24/2016 0851   BUN 12 08/03/2013 0838   CREATININE 0.79 08/24/2016 0851   CREATININE 0.90 08/03/2013 0838      Component Value Date/Time   CALCIUM 9.8 08/24/2016 0851   CALCIUM 9.5 08/03/2013 0838   ALKPHOS 89 08/24/2016 0851   ALKPHOS 115 08/03/2013 0838   AST 17 08/24/2016 0851   AST 18 08/03/2013 0838   ALT 14 08/24/2016 0851   ALT 23 08/03/2013 0838   BILITOT 0.6 08/24/2016 0851   BILITOT 0.4 08/03/2013 0838       RADIOGRAPHIC STUDIES: I have personally reviewed the radiological images as listed and agreed with the findings in the report. No results found.   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 Recent MRI December 2017-NED. Continue surveillance as per [facility].  # Bilateral lung nodules- 1.3-1.7 centimeters in size [most recent x-ray April 2018]; followed by Dr. Judee Clara. Awaiting a CT scan in August 2018- no biopsy done so far.   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  # Abnormal mammogram left side- July 2017- cyst aspirated by Dr. Tollie Pizza. Recommend repeat mammogram in July 2018. This has been ordered.   # Follow-up in one year; labs.   # # 25 minutes face-to-face with the patient discussing the above plan of care; more than 50% of time spent on prognosis/ natural history; counseling and coordination. Reviewed the records from [facility] in detail./Summarized as above.    Orders Placed This Encounter  Procedures  . MM DIAG BREAST TOMO UNI LEFT    Standing Status:   Future    Standing Expiration Date:   10/24/2017    Order Specific Question:   Reason for Exam (SYMPTOM  OR DIAGNOSIS REQUIRED)    Answer:   history of right breast cancer; h/o of left breast mass    Order Specific Question:    Preferred imaging location?    Answer:   [facility]  . US Breast Limited Uni Left Inc Axilla  Standing Status:   Future    Standing Expiration Date:   10/24/2017    Order Specific Question:   Reason for Exam (SYMPTOM  OR DIAGNOSIS REQUIRED)    Answer:   history of right breast cancer; h/o of left breast mass    Order Specific Question:   Preferred imaging location?    Answer:   [facility]  . CBC with Differential    Standing Status:   Future    Standing Expiration Date:   02/23/2018  . Comprehensive metabolic panel    Standing Status:   Future    Standing Expiration Date:   02/23/2018  . Lactate dehydrogenase    Standing Status:   Future    Standing Expiration Date:   02/23/2018   All questions were answered. The patient knows to call the clinic with any problems, questions or concerns.      Cammie Sickle, MD 08/24/2016 10:57 AM

## 2016-08-24 NOTE — Progress Notes (Signed)
Patient here for follow up. No changes since last appointment.  

## 2016-09-02 ENCOUNTER — Encounter: Payer: Self-pay | Admitting: Obstetrics and Gynecology

## 2016-09-02 ENCOUNTER — Ambulatory Visit (INDEPENDENT_AMBULATORY_CARE_PROVIDER_SITE_OTHER): Payer: 59 | Admitting: Obstetrics and Gynecology

## 2016-09-02 VITALS — BP 152/86 | HR 84 | Ht 69.0 in | Wt 285.6 lb

## 2016-09-02 DIAGNOSIS — R102 Pelvic and perineal pain: Secondary | ICD-10-CM

## 2016-09-02 NOTE — Progress Notes (Signed)
Subjective:     Patient ID: Danielle Hansen, female   DOB: April 13, 1950, 66 y.o.   MRN: [id number]  HPI Reports return of pelvic pains/twinges over last month. Consistent with pains noted end of last year associated with ovarian cyst. Denies changes in BM, urination, or vaginal bleeding. Does have h/o breast cancer.   Review of Systems Negative except stated above in HPI    Objective:   Physical Exam A&O x4 Well groomed female in no distress Blood pressure (!) 152/86, pulse 84, height 5\' 9"  (1.753 m), weight 285 lb 9.6 oz (129.5 kg). Body mass index is 42.18 kg/m.  Pelvic exam: normal external genitalia, vulva, vagina, cervix, uterus and adnexa, exam limited by obesity, but did report tenderness in LLQ on bi-manual exam.    Assessment:     Pelvic pain H/O breast caner H/O ovarian cyst Uterine fibroids     Plan:     Will return for pelvic ultrasound at next available appointment, and we will follow up accordingly. Reassured of normal findings at today's visit.  Melody Newhope, CNM

## 2016-09-08 ENCOUNTER — Ambulatory Visit (INDEPENDENT_AMBULATORY_CARE_PROVIDER_SITE_OTHER): Payer: 59

## 2016-09-08 DIAGNOSIS — R102 Pelvic and perineal pain: Secondary | ICD-10-CM | POA: Diagnosis not present

## 2016-09-30 ENCOUNTER — Ambulatory Visit
Admission: RE | Admit: 2016-09-30 | Discharge: 2016-09-30 | Disposition: A | Discharge status: Home / Self Care | Payer: Commercial Managed Care - HMO | Source: Ambulatory Visit | Attending: Internal Medicine | Admitting: Internal Medicine

## 2016-09-30 DIAGNOSIS — Z9889 Other specified postprocedural states: Secondary | ICD-10-CM | POA: Insufficient documentation

## 2016-09-30 DIAGNOSIS — Z170 Estrogen receptor positive status [ER+]: Secondary | ICD-10-CM

## 2016-09-30 DIAGNOSIS — C50811 Malignant neoplasm of overlapping sites of right female breast: Secondary | ICD-10-CM

## 2016-09-30 DIAGNOSIS — Z9011 Acquired absence of right breast and nipple: Secondary | ICD-10-CM | POA: Diagnosis not present

## 2016-09-30 DIAGNOSIS — Z853 Personal history of malignant neoplasm of breast: Secondary | ICD-10-CM | POA: Diagnosis not present

## 2016-09-30 HISTORY — PX: BREAST CYST ASPIRATION: SHX578

## 2016-12-30 HISTORY — PX: FRACTURE SURGERY: SHX138

## 2017-01-08 ENCOUNTER — Other Ambulatory Visit: Payer: Self-pay

## 2017-01-08 ENCOUNTER — Emergency Department: Payer: Medicare Other

## 2017-01-08 ENCOUNTER — Emergency Department
Admission: EM | Admit: 2017-01-08 | Discharge: 2017-01-08 | Disposition: A | Discharge status: Other Acute Inpatient Hospital | Payer: Medicare Other | Attending: Emergency Medicine | Admitting: Emergency Medicine

## 2017-01-08 ENCOUNTER — Encounter: Payer: Self-pay | Admitting: Emergency Medicine

## 2017-01-08 DIAGNOSIS — S6991XA Unspecified injury of right wrist, hand and finger(s), initial encounter: Secondary | ICD-10-CM | POA: Diagnosis present

## 2017-01-08 DIAGNOSIS — Z79899 Other long term (current) drug therapy: Secondary | ICD-10-CM | POA: Insufficient documentation

## 2017-01-08 DIAGNOSIS — Y9289 Other specified places as the place of occurrence of the external cause: Secondary | ICD-10-CM | POA: Insufficient documentation

## 2017-01-08 DIAGNOSIS — Z853 Personal history of malignant neoplasm of breast: Secondary | ICD-10-CM | POA: Insufficient documentation

## 2017-01-08 DIAGNOSIS — W108XXA Fall (on) (from) other stairs and steps, initial encounter: Secondary | ICD-10-CM | POA: Insufficient documentation

## 2017-01-08 DIAGNOSIS — Y999 Unspecified external cause status: Secondary | ICD-10-CM | POA: Diagnosis not present

## 2017-01-08 DIAGNOSIS — E039 Hypothyroidism, unspecified: Secondary | ICD-10-CM | POA: Diagnosis not present

## 2017-01-08 DIAGNOSIS — Y9389 Activity, other specified: Secondary | ICD-10-CM | POA: Diagnosis not present

## 2017-01-08 DIAGNOSIS — S62101A Fracture of unspecified carpal bone, right wrist, initial encounter for closed fracture: Secondary | ICD-10-CM | POA: Diagnosis not present

## 2017-01-08 DIAGNOSIS — I10 Essential (primary) hypertension: Secondary | ICD-10-CM | POA: Diagnosis not present

## 2017-01-08 MED ORDER — DOUBLE ANTIBIOTIC 500-10000 UNIT/GM EX OINT: TOPICAL_OINTMENT | Freq: Once | CUTANEOUS | Status: DC

## 2017-01-08 MED ORDER — HYDROMORPHONE HCL 1 MG/ML IJ SOLN
INTRAMUSCULAR | Status: AC
  Administered 2017-01-08: 1 mg via INTRAVENOUS
  Filled 2017-01-08: qty 1

## 2017-01-08 MED ORDER — HYDROMORPHONE HCL 1 MG/ML IJ SOLN
0.5000 mg | Freq: Once | INTRAMUSCULAR | Status: AC
  Administered 2017-01-08: 0.5 mg via INTRAVENOUS
  Filled 2017-01-08: qty 1

## 2017-01-08 MED ORDER — BACITRACIN ZINC 500 UNIT/GM EX OINT
TOPICAL_OINTMENT | Freq: Once | CUTANEOUS | Status: AC
  Administered 2017-01-08: 16:00:00 via TOPICAL
  Filled 2017-01-08: qty 0.9

## 2017-01-08 MED ORDER — FENTANYL CITRATE (PF) 100 MCG/2ML IJ SOLN
50.0000 ug | INTRAMUSCULAR | Status: DC | PRN
  Administered 2017-01-08: 50 ug via INTRAVENOUS
  Filled 2017-01-08: qty 2

## 2017-01-08 MED ORDER — HYDROMORPHONE HCL 1 MG/ML IJ SOLN
1.0000 mg | Freq: Once | INTRAMUSCULAR | Status: AC
  Administered 2017-01-08: 1 mg via INTRAVENOUS

## 2017-01-08 NOTE — ED Triage Notes (Signed)
Pt arrived via EMS from [employer] s/p fall.  Pt was walking out of the store and missed the step falling onto her right wrist.  EMS reports obvious deformity. EMS attempted IV access x 2 unsuccessful.

## 2017-01-08 NOTE — ED Notes (Signed)
emtala reviewed by this RN 

## 2017-01-08 NOTE — ED Provider Notes (Addendum)
[facility] Emergency Department Provider Note   ____________________________________________   First MD Initiated Contact with Patient 01/08/17 1416     (approximate)  I have reviewed the triage vital signs and the nursing notes.   HISTORY  Chief Complaint Wrist Pain and Fall    HPI Danielle Hansen is a 66 y.o. female . Reports she was walking out of a store [place] missed a step and fell. He complains of pain in the wrist and forearm and the way the elbow. She reports she's had a cuts on her wrists forearm and elbow. EMS reported obvious deformity in the wrist. Patient had her last tetanus shot in 2012. Patient is having moderate amount of pain. She got fentanyl 50 IV for this.patient denies any other injury. Specifically she did not fall and hit her head or neck and has no headache or neck pain.   Past Medical History:  Diagnosis Date  . Arthritis   . Breast cancer ([place]) 2002   rt mastectomy/chemo  . Cancer ([place]) 09/09/00   rt mastectomy-09/09/00  . 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 Degenerative disc disease, cervical    no limitations  . GERD (gastroesophageal reflux disease)   . Hypertension   . Hypothyroid   . Lung nodule    Dr Kendall Flack  . 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 Uterine fibroid     Patient Active Problem List   Diagnosis Date Noted  . Carcinoma of overlapping sites of right breast in female, estrogen receptor positive ([place]) 08/24/2016  . Breast cyst 09/03/2015  . Other long term (current) drug therapy 11/20/2014  . Cervical radiculitis 08/15/2014  . Muscle spasms of neck 08/15/2014  . 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 DDD (degenerative disc disease), lumbar 11/18/2013  . Benign essential HTN 10/03/2013  . Acid reflux 10/03/2013  . Big thyroid 10/03/2013  . Hypercholesterolemia 10/03/2013  . Lung nodule, solitary 01/11/2012  . Malignant  neoplasm of skeletal system ([place]) 07/06/2011  . H/O malignant neoplasm of breast 08/29/2000    Past Surgical History:  Procedure Laterality Date  . BREAST CYST ASPIRATION Left 09/30/2016   FNA done at Dr. Dwyane Luo office per notes   . BUNIONECTOMY Right 2017  . COLONOSCOPY  2014   Verdie Shire, MD  . KNEE ARTHROSCOPY Right 05/03  . MASTECTOMY Right 09/09/00   09/09/00-Dr Pat Patrick mastectomy/chemo  . OOPHORECTOMY Right 1990   right tube and ovary removed  . SALPINGECTOMY Right 1990  . SOFT TISSUE TUMOR RESECTION Left 7/09, 6/11   gluteal    Prior to Admission medications   Medication Sig Start Date End Date Taking? Authorizing Provider  acetaminophen (TYLENOL) 325 MG tablet Take 650 mg by mouth every 6 (six) hours as needed.    [provider]  Calcium Carbonate-Vitamin D (CALCIUM + D PO) CALCITRATE/VITAMIN D 315-200 MG-UNIT TABSGeneric: CALCIUM CITRATE-VITAMIN Dtwo by mouth every morning and one by mouth every evening 07/31/09   [provider]  cyclobenzaprine (FLEXERIL) 5 MG tablet Take 5 mg by mouth.    [provider]  hydrochlorothiazide (HYDRODIURIL) 25 MG tablet 25 mg. 01/26/13   [provider]  levothyroxine (SYNTHROID, LEVOTHROID) 125 MCG tablet Take 125 mcg by mouth daily before breakfast.    [provider]  Magnesium Gluconate 27.5 MG TABS Take 250 mg by mouth.    [provider]  omeprazole (PRILOSEC) 20 MG capsule  Take 20 mg by mouth. 07/31/09   [provider]  simvastatin (ZOCOR) 20 MG tablet Take 20 mg by mouth. 05/14/08   [provider]  vitamin B-12 (CYANOCOBALAMIN) 500 MCG tablet Take by mouth.    [provider]    Allergies Patient has no known allergies.  Family History  Problem Relation Age of Onset  . Breast cancer Sister 38       2004, 2011  . Heart disease Sister   . Diabetes Father   . Hypertension Father   . Heart disease Brother   . Uterine cancer Sister 14    Social  History Social History   Tobacco Use  . Smoking status: Never Smoker  . Smokeless tobacco: Never Used  Substance Use Topics  . Alcohol use: No    Alcohol/week: 0.0 oz  . Drug use: No    Review of Systems  Constitutional: No fever/chills Eyes: No visual changes. ENT: No sore throat. Cardiovascular: Denies chest pain. Respiratory: Denies shortness of breath. Gastrointestinal: No abdominal pain.  No nausea, no vomiting.  No diarrhea.  No constipation. Genitourinary: Negative for dysuria. Musculoskeletal: Negative for back pain. Skin: Negative for rash. Neurological: Negative for headaches, focal weakness   ____________________________________________   PHYSICAL EXAM:  VITAL SIGNS: ED Triage Vitals  Enc Vitals Group     BP 01/08/17 1403 (!) 143/115     Pulse Rate 01/08/17 1403 91     Resp 01/08/17 1403 20     Temp 01/08/17 1403 98.1 F (36.7 C)     Temp Source 01/08/17 1403 Oral     SpO2 01/08/17 1403 97 %     Weight 01/08/17 1403 280 lb (127 kg)     Height 01/08/17 1403 5\' 9"  (1.753 m)     Head Circumference --      Peak Flow --      Pain Score 01/08/17 1402 10     Pain Loc --      Pain Edu? --      Excl. in [place]? --     Constitutional: Alert and oriented. Well appearing and in no acute distress. Eyes: Conjunctivae are normal.  Head: Atraumatic. Nose: No congestion/rhinnorhea. Mouth/Throat: Mucous membranes are moist.  Oropharynx non-erythematous. Neck: No stridor.   Cardiovascular: Normal rate, regular rhythm. Grossly normal heart sounds.  Good peripheral circulation. Respiratory: Normal respiratory effort.  No retractions. Lungs CTAB. Gastrointestinal: Soft and nontender. No distention. No abdominal bruits. No CVA tenderness. Musculoskeletal: No lower extremity tenderness nor edema.  sensation and capillary refill are intact in the fingers.     Neurologic:  Normal speech and language. No gross focal neurologic deficits are appreciated. No gait  instability. Skin:  Skin is warm, dry and intact. No rash noted. Psychiatric: Mood and affect are normal. Speech and behavior are normal.  ____________________________________________   LABS (all labs ordered are listed, but only abnormal results are displayed) ____________________________________________  EKG  ____________________________________________  RADIOLOGY wrist fracture____________________________________________   PROCEDURES  Procedure(s) performed:  Procedures  Critical Care performed:   ____________________________________________   INITIAL IMPRESSION / ASSESSMENT AND PLAN / ED COURSE  As part of my medical decision making, I reviewed the following data within the [facility] records from the hospital and care everywhere were reviewed. Discussed patient with Dr. Posey Pronto orthopedics. He thinks the patient is too complicated to admit here. Discussed patient with Dr. Quintin Alto of the hand surgeon but she does not have privileges here yet. We'll transfer the  patient to [facility]. I'm awaiting them to call back Dr.Veronese is aware.    there are 2 scratches on the patient's forearm one has an area which is less than 1 cm in length which is somewhat deeper. These were cleaned with saline I will use a bandage to close the deeper area of the one wound.Dr. Aggie Hacker will accept ER to ER transfer.   ____________________________________________   FINAL CLINICAL IMPRESSION(S) / ED DIAGNOSES  Final diagnoses:  Closed fracture of right wrist, initial encounter     ED Discharge Orders    None       Note:  This document was prepared using Dragon voice recognition software and may include unintentional dictation errors.    Nena Polio, MD 01/08/17 4166    Nena Polio, MD 01/08/17 [phone]

## 2017-01-08 NOTE — ED Notes (Signed)
Wound cleansed with NS, dressed with bacitracin and bandaid applied.

## 2017-01-08 NOTE — ED Notes (Signed)
Pt up to the bathroom with stand by assistance.  Pt given lemon mouth swabs. Pt has been NPO since approx 1230 this afternoon.

## 2017-01-08 NOTE — ED Notes (Signed)
EMS splint placed back onto right wrist per ortho request.

## 2017-01-11 DIAGNOSIS — Z8781 Personal history of (healed) traumatic fracture: Secondary | ICD-10-CM | POA: Insufficient documentation

## 2017-02-18 ENCOUNTER — Encounter: Payer: Self-pay | Admitting: Obstetrics and Gynecology

## 2017-02-18 ENCOUNTER — Ambulatory Visit (INDEPENDENT_AMBULATORY_CARE_PROVIDER_SITE_OTHER): Payer: Medicare Other | Admitting: Obstetrics and Gynecology

## 2017-02-18 VITALS — BP 143/79 | HR 82 | Ht 69.0 in | Wt 282.2 lb

## 2017-02-18 DIAGNOSIS — Z01419 Encounter for gynecological examination (general) (routine) without abnormal findings: Secondary | ICD-10-CM

## 2017-02-18 NOTE — Progress Notes (Signed)
Subjective:   Danielle Hansen is a 66 y.o. G52P1 Caucasian female here for a routine well-woman exam.  No LMP recorded. Patient is postmenopausal.    Current complaints: none gynecologically PCP: [place]       does desire labs  Social History: Sexual: heterosexual Marital Status: married Living situation: spouse Occupation: unknown occupation Tobacco/alcohol: no tobacco use Illicit drugs: no history of illicit drug use  The following portions of the patient's history were reviewed and updated as appropriate: allergies, current medications, past family history, past medical history, past social history, past surgical history and problem list.  Past Medical History Past Medical History:  Diagnosis Date  . Arthritis   . Breast cancer ([place]) 2002   rt mastectomy/chemo  . Cancer ([place]) 09/09/00   rt mastectomy-09/09/00  . 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 Degenerative disc disease, cervical    no limitations  . GERD (gastroesophageal reflux disease)   . Hypertension   . Hypothyroid   . Lung nodule    Dr Kendall Flack  . 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 Uterine fibroid     Past Surgical History Past Surgical History:  Procedure Laterality Date  . BREAST CYST ASPIRATION Left 09/30/2016   FNA done at Dr. Dwyane Luo office per notes   . BUNIONECTOMY Right 2017  . COLONOSCOPY  2014   Verdie Shire, [place] Right 05/08/2015   Procedure: LAPIPLASTY 1ST METATARSAL GREAT TOE;  Surgeon: Albertine Patricia, DPM;  Location: [place];  Service: Podiatry;  Laterality: Right;  . KNEE ARTHROSCOPY Right 05/03  . MASTECTOMY Right 09/09/00   09/09/00-Dr Pat Patrick mastectomy/chemo  . OOPHORECTOMY Right 1990   right tube and ovary removed  . SALPINGECTOMY Right 1990  . SOFT TISSUE TUMOR RESECTION Left 7/09, 6/11   gluteal    Gynecologic History G1P1  No LMP recorded. Patient is postmenopausal. Contraception:  post menopausal status Last Pap: 2016. Results were: normal Last mammogram: 2018. Results were: normal   Obstetric History OB History  Gravida Para Term Preterm AB Living  1 1          SAB TAB Ectopic Multiple Live Births               # Outcome Date GA Lbr Len/2nd Weight Sex Delivery Anes PTL Lv  1 Para             Obstetric Comments  1st Menstrual Cycle:  13  1st Pregnancy:  25    Current Medications Current Outpatient Medications on File Prior to Visit  Medication Sig Dispense Refill  . acetaminophen (TYLENOL) 325 MG tablet Take 650 mg by mouth every 6 (six) hours as needed.    . Calcium Carbonate-Vitamin D (CALCIUM + D PO) CALCITRATE/VITAMIN D 315-200 MG-UNIT TABSGeneric: CALCIUM CITRATE-VITAMIN Dtwo by mouth every morning and one by mouth every evening    . cyclobenzaprine (FLEXERIL) 5 MG tablet Take 5 mg by mouth.    . hydrochlorothiazide (HYDRODIURIL) 25 MG tablet 25 mg.    . levothyroxine (SYNTHROID, LEVOTHROID) 125 MCG tablet Take 125 mcg by mouth daily before breakfast.    . Magnesium Gluconate 27.5 MG TABS Take 250 mg by mouth.    Marland Kitchen omeprazole (PRILOSEC) 20 MG capsule Take 20 mg by mouth.    . simvastatin (ZOCOR) 20 MG tablet Take 20 mg by mouth.    . vitamin B-12 (CYANOCOBALAMIN) 500 MCG tablet Take  by mouth.     No current facility-administered medications on file prior to visit.     Review of Systems Patient denies any headaches, blurred vision, shortness of breath, chest pain, abdominal pain, problems with bowel movements, urination, or intercourse.  Objective:  BP (!) 143/79   Pulse 82   Ht 5\' 9"  (1.753 m)   Wt 282 lb 3.2 oz (128 kg)   BMI 41.67 kg/m  Physical Exam  General:  Well developed, well nourished, no acute distress. She is alert and oriented x3. Skin:  Warm and dry Neck:  Midline trachea, no thyromegaly or nodules Cardiovascular: Regular rate and rhythm, no murmur heard Lungs:  Effort normal, all lung fields clear to auscultation  bilaterally Breasts:  No dominant palpable mass, retraction, or nipple discharge Abdomen:  Soft, non tender, no hepatosplenomegaly or masses Pelvic:  External genitalia is normal in appearance.  The vagina is normal in appearance. The cervix is bulbous, no CMT.  Thin prep pap is not done. Uterus is felt to be normal size, shape, and contour.  No adnexal masses or tenderness noted. Extremities:  No swelling or varicosities noted Psych:  She has a normal mood and affect  Assessment:   Healthy well-woman exam  Plan:  Labs obtained-will follow up accordingly F/U 1 year for AE, or sooner if needed   Zamarah Ullmer Rockney Ghee, CNM

## 2017-06-06 ENCOUNTER — Encounter: Payer: Medicare Other | Attending: Internal Medicine | Admitting: *Deleted

## 2017-06-06 ENCOUNTER — Encounter: Payer: Self-pay | Admitting: *Deleted

## 2017-06-06 VITALS — BP 120/82 | Ht 69.0 in | Wt 275.1 lb

## 2017-06-06 DIAGNOSIS — Z713 Dietary counseling and surveillance: Secondary | ICD-10-CM | POA: Diagnosis present

## 2017-06-06 DIAGNOSIS — E119 Type 2 diabetes mellitus without complications: Secondary | ICD-10-CM | POA: Insufficient documentation

## 2017-06-06 NOTE — Patient Instructions (Signed)
Exercise: Continue water aerobics  for   60  minutes   2  days a week and add 30 minutes walking per week  Eat 3 meals day, 1  snack a day Space meals 4-6 hours apart  Return for classes on:

## 2017-06-07 NOTE — Progress Notes (Signed)
Diabetes Self-Management Education  Visit Type: First/Initial  Appt. Start Time: 1605 Appt. End Time: 7106  06/06/2017  Danielle Hansen, identified by name and date of birth, is a 67 y.o. female with a diagnosis of Diabetes: Type 2.   ASSESSMENT  Blood pressure 120/82, height 5\' 9"  (1.753 m), weight 275 lb 1.6 oz (124.8 kg). Body mass index is 40.63 kg/m.  Diabetes Self-Management Education - 06/06/17 1723      Visit Information   Visit Type  First/Initial      Initial Visit   Diabetes Type  Type 2    Are you currently following a meal plan?  Yes    What type of meal plan do you follow?  "watching sugar and carbs"    Are you taking your medications as prescribed?  Yes    Date Diagnosed  March 2019      Health Coping   How would you rate your overall health?  Good      Psychosocial Assessment   Patient Belief/Attitude about Diabetes  Motivated to manage diabetes    Self-care barriers  None    Self-management support  Doctor's office;Family    Patient Concerns  Nutrition/Meal planning;Glycemic Control;Weight Control;Medication    Special Needs  None    Preferred Learning Style  Auditory    Learning Readiness  Change in progress    How often do you need to have someone help you when you read instructions, pamphlets, or other written materials from your doctor or pharmacy?  1 - Never    What is the last grade level you completed in school?  12th      Pre-Education Assessment   Patient understands the diabetes disease and treatment process.  Needs Instruction    Patient understands incorporating nutritional management into lifestyle.  Needs Instruction    Patient undertands incorporating physical activity into lifestyle.  Needs Instruction    Patient understands using medications safely.  Needs Instruction    Patient understands monitoring blood glucose, interpreting and using results  Needs Instruction    Patient understands prevention, detection, and treatment of acute  complications.  Needs Instruction    Patient understands prevention, detection, and treatment of chronic complications.  Needs Instruction    Patient understands how to develop strategies to address psychosocial issues.  Needs Instruction    Patient understands how to develop strategies to promote health/change behavior.  Needs Instruction      Complications   Last HgB A1C per patient/outside source  7.1 % 05/20/17    How often do you check your blood sugar?  Patient declines Pt doesn't want to start testing yet. BG in the office was 89 mg/dL at 5:00 pm - 4 1/2 hrs pp.     Have you had a dilated eye exam in the past 12 months?  Yes    Have you had a dental exam in the past 12 months?  Yes    Are you checking your feet?  No      Dietary Intake   Breakfast  cottage cheese and fruit; tortilla with egg and salsa    Lunch  grilled chicken salad    Dinner  tuna, beef, pork, potatoes, beans, peas, rice, tomatoes, cuccumbers, carrots, radishes, celery, salads    Beverage(s)  water, coffee, flavored water      Exercise   Exercise Type  Moderate (swimming / aerobic walking)    How many days per week to you exercise?  2    How  many minutes per day do you exercise?  60    Total minutes per week of exercise  120      Patient Education   Previous Diabetes Education  No    Disease state   Definition of diabetes, type 1 and 2, and the diagnosis of diabetes;Factors that contribute to the development of diabetes    Nutrition management   Role of diet in the treatment of diabetes and the relationship between the three main macronutrients and blood glucose level;Food label reading, portion sizes and measuring food.;Carbohydrate counting    Physical activity and exercise   Role of exercise on diabetes management, blood pressure control and cardiac health.    Medications  Reviewed patients medication for diabetes, action, purpose, timing of dose and side effects.    Monitoring  Identified appropriate SMBG  and/or A1C goals.    Chronic complications  Relationship between chronic complications and blood glucose control    Psychosocial adjustment  Identified and addressed patients feelings and concerns about diabetes      Individualized Goals (developed by patient)   Reducing Risk  Improve blood sugars Decrease medications Lose weight     Outcomes   Expected Outcomes  Demonstrated interest in learning. Expect positive outcomes    Future DMSE  2 wks       Individualized Plan for Diabetes Self-Management Training:   Learning Objective:  Patient will have a greater understanding of diabetes self-management. Patient education plan is to attend individual and/or group sessions per assessed needs and concerns.   Plan:   Patient Instructions  Exercise: Continue water aerobics  for   60  minutes   2  days a week and add 30 minutes walking per week Eat 3 meals day, 1  snack a day Space meals 4-6 hours apart  Expected Outcomes:  Demonstrated interest in learning. Expect positive outcomes  Education material provided:  General Meal Planning Guidelines Simple Meal Plan  If problems or questions, patient to contact team via:  Johny Drilling, [place], [place], CDE [phone]  Future DSME appointment: 2 wks  June 20, 2017 for Diabetes Class 1

## 2017-06-20 ENCOUNTER — Encounter: Payer: Medicare Other | Admitting: Dietician

## 2017-06-20 ENCOUNTER — Encounter: Payer: Self-pay | Admitting: Dietician

## 2017-06-20 VITALS — Ht 69.0 in | Wt 268.5 lb

## 2017-06-20 DIAGNOSIS — Z713 Dietary counseling and surveillance: Secondary | ICD-10-CM | POA: Diagnosis not present

## 2017-06-20 DIAGNOSIS — E119 Type 2 diabetes mellitus without complications: Secondary | ICD-10-CM

## 2017-06-20 NOTE — Progress Notes (Signed)

## 2017-06-27 ENCOUNTER — Encounter: Payer: Self-pay | Admitting: *Deleted

## 2017-06-27 ENCOUNTER — Encounter: Payer: Medicare Other | Admitting: *Deleted

## 2017-06-27 VITALS — Wt 264.1 lb

## 2017-06-27 DIAGNOSIS — E119 Type 2 diabetes mellitus without complications: Secondary | ICD-10-CM

## 2017-06-27 DIAGNOSIS — Z713 Dietary counseling and surveillance: Secondary | ICD-10-CM | POA: Diagnosis not present

## 2017-06-27 NOTE — Progress Notes (Signed)

## 2017-07-04 ENCOUNTER — Encounter: Payer: Self-pay | Admitting: Dietician

## 2017-07-04 ENCOUNTER — Encounter: Payer: Medicare Other | Attending: Internal Medicine | Admitting: Dietician

## 2017-07-04 VITALS — BP 120/80 | Ht 69.0 in | Wt 262.5 lb

## 2017-07-04 DIAGNOSIS — E119 Type 2 diabetes mellitus without complications: Secondary | ICD-10-CM

## 2017-07-04 DIAGNOSIS — Z713 Dietary counseling and surveillance: Secondary | ICD-10-CM | POA: Diagnosis present

## 2017-07-04 NOTE — Progress Notes (Signed)

## 2017-07-08 ENCOUNTER — Encounter: Payer: Self-pay | Admitting: *Deleted

## 2017-08-23 ENCOUNTER — Encounter: Payer: Self-pay | Admitting: Internal Medicine

## 2017-08-23 ENCOUNTER — Inpatient Hospital Stay: Payer: Medicare Other | Attending: Internal Medicine | Admitting: Internal Medicine

## 2017-08-23 ENCOUNTER — Inpatient Hospital Stay: Payer: Medicare Other

## 2017-08-23 VITALS — BP 115/78 | HR 84 | Temp 97.0°F | Resp 16 | Wt 247.0 lb

## 2017-08-23 DIAGNOSIS — Z7984 Long term (current) use of oral hypoglycemic drugs: Secondary | ICD-10-CM

## 2017-08-23 DIAGNOSIS — R918 Other nonspecific abnormal finding of lung field: Secondary | ICD-10-CM

## 2017-08-23 DIAGNOSIS — E119 Type 2 diabetes mellitus without complications: Secondary | ICD-10-CM

## 2017-08-23 DIAGNOSIS — E039 Hypothyroidism, unspecified: Secondary | ICD-10-CM | POA: Diagnosis not present

## 2017-08-23 DIAGNOSIS — Z9011 Acquired absence of right breast and nipple: Secondary | ICD-10-CM

## 2017-08-23 DIAGNOSIS — Z9221 Personal history of antineoplastic chemotherapy: Secondary | ICD-10-CM

## 2017-08-23 DIAGNOSIS — I10 Essential (primary) hypertension: Secondary | ICD-10-CM | POA: Diagnosis not present

## 2017-08-23 DIAGNOSIS — C50811 Malignant neoplasm of overlapping sites of right female breast: Secondary | ICD-10-CM

## 2017-08-23 DIAGNOSIS — Z85831 Personal history of malignant neoplasm of soft tissue: Secondary | ICD-10-CM | POA: Diagnosis not present

## 2017-08-23 DIAGNOSIS — Z170 Estrogen receptor positive status [ER+]: Secondary | ICD-10-CM | POA: Diagnosis not present

## 2017-08-23 DIAGNOSIS — Z853 Personal history of malignant neoplasm of breast: Secondary | ICD-10-CM

## 2017-08-23 DIAGNOSIS — Z923 Personal history of irradiation: Secondary | ICD-10-CM | POA: Diagnosis not present

## 2017-08-23 DIAGNOSIS — Z9223 Personal history of estrogen therapy: Secondary | ICD-10-CM

## 2017-08-23 DIAGNOSIS — Z79899 Other long term (current) drug therapy: Secondary | ICD-10-CM

## 2017-08-23 DIAGNOSIS — K219 Gastro-esophageal reflux disease without esophagitis: Secondary | ICD-10-CM

## 2017-08-23 DIAGNOSIS — C419 Malignant neoplasm of bone and articular cartilage, unspecified: Secondary | ICD-10-CM

## 2017-08-23 LAB — CBC WITH DIFFERENTIAL/PLATELET
Basophils Absolute: 0 10*3/uL (ref 0–0.1)
Basophils Relative: 1 %
Eosinophils Absolute: 0.1 10*3/uL (ref 0–0.7)
Eosinophils Relative: 1 %
HCT: 37.6 % (ref 35.0–47.0)
HEMOGLOBIN: 12.3 g/dL (ref 12.0–16.0)
LYMPHS ABS: 1 10*3/uL (ref 1.0–3.6)
Lymphocytes Relative: 20 %
MCH: 25.5 pg — AB (ref 26.0–34.0)
MCHC: 32.9 g/dL (ref 32.0–36.0)
MCV: 77.7 fL — ABNORMAL LOW (ref 80.0–100.0)
MONOS PCT: 6 %
Monocytes Absolute: 0.3 10*3/uL (ref 0.2–0.9)
NEUTROS ABS: 3.7 10*3/uL (ref 1.4–6.5)
NEUTROS PCT: 72 %
Platelets: 271 10*3/uL (ref 150–440)
RBC: 4.83 MIL/uL (ref 3.80–5.20)
RDW: 16.6 % — ABNORMAL HIGH (ref 11.5–14.5)
WBC: 5.1 10*3/uL (ref 3.6–11.0)

## 2017-08-23 LAB — LACTATE DEHYDROGENASE: LDH: 116 U/L (ref 98–192)

## 2017-08-23 NOTE — Assessment & Plan Note (Addendum)
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  Bone scan negative Jan 2019.  Continue surveillance at [facility]  # Bilateral lung nodules-likely secondary to upper sarcoma; no biopsy done 1.3-1.7 centimeters in size [most recent x-ray may 2019- STABLE; followed by Dr. Judee Clara.   # Microcytosis-normal hemoglobin.  Recommend p.o. iron once a day.  # Triple positive right breast cancer [2004]- stage II; stable no evidence of recurrence; screening left mammogram in aug-sep 2019.   #Newly diagnosed diabetes on metformin; stable.  # Follow-up in one year; labs.

## 2017-08-23 NOTE — Progress Notes (Signed)
[facility] OFFICE PROGRESS NOTE  Patient Care Team: Ezequiel Kayser, MD as PCP - General (Internal Medicine) Forest Gleason, MD (Inactive) (Oncology) Bary Castilla Forest Gleason, MD (General Surgery)  Cancer Staging No matching staging information was found for the patient.   Oncology History   Chief Complaint/Problem List  Breast cancer: [id number], stage II. HER-2/neu 3 +. Estrogen receptor greater than 90%. Progesterone receptor greater than 90%.    Previous therapy:   1. Right mastectomy and axillary lymph node dissection- s/p. Adriamycin and Cytoxan.  3. Tamoxifen 20 mg daily  4. Vaginal bleeding on Tamoxifen in November 2004.   5. Aromasin 25 mg daily starting January 2005. 6. Finished aromosin   was seen in November of 2007, and has refused NSABP B. 42 study  7. Left thigh sarcoma, 2009 [[facility]; s/p Sarcoma spl; Dr.Savage; [facility]; followed by local recurrence s/p RT; Dr.Crystal; no chemo]; last MRI dec 2017- NED   # 2015- Bil Lung nodules [1.3-1.7cm; CXR- April 2018; Dr.Savage]  -----------------------------------------------------  S/p resection ([facility]) July 2009. 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 Focal margin positivity.Three benign appearing lymph nodes.  Surveillance plan: MRI q 6 month through 2014, then annually.     Malignant neoplasm of skeletal system (HCC)    Carcinoma of overlapping sites of right breast in female, estrogen receptor positive ([place])      INTERVAL HISTORY:  Danielle Hansen 67 y.o.  female pleasant patient above history of sarcoma/lung nodules; also history of breast cancer is here for follow-up.  Patient follows up closely with [facility] for history of sarcoma.  Reviewed the recent records from [facility].  Patient denies any shortness of breath or cough.  Appetite is good.  No weight loss.  No nausea no vomiting.  She has been recently diagnosed with  diabetes.  She is on metformin.  Denies any blood in stools or black or stools.  Review of Systems  Constitutional: Negative for chills, diaphoresis, fever, malaise/fatigue and weight loss.  HENT: Negative for nosebleeds and sore throat.   Eyes: Negative for double vision.  Respiratory: Negative for cough, hemoptysis, sputum production, shortness of breath and wheezing.   Cardiovascular: Negative for chest pain, palpitations, orthopnea and leg swelling.  Gastrointestinal: Negative for abdominal pain, blood in stool, constipation, diarrhea, heartburn, melena, nausea and vomiting.  Genitourinary: Negative for dysuria, frequency and urgency.  Musculoskeletal: Negative for back pain and joint pain.  Skin: Negative.  Negative for itching and rash.  Neurological: Negative for dizziness, tingling, focal weakness, weakness and headaches.  Endo/Heme/Allergies: Does not bruise/bleed easily.  Psychiatric/Behavioral: Negative for depression. The patient is not nervous/anxious and does not have insomnia.       PAST MEDICAL HISTORY :  Past Medical History:  Diagnosis Date  . Arthritis   . Breast cancer ([place]) 2002   rt mastectomy/chemo  . Cancer ([place]) 09/09/00   rt mastectomy-09/09/00  . Cancer [facility]) 2009/2011   had 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 /[facility]/ Dr Leonides Schanz  . Degenerative disc disease, cervical    no limitations  . Diabetes mellitus without complication ([place])   . GERD (gastroesophageal reflux disease)   . Hypertension   . Hypothyroid   . Lung nodule    Dr Kendall Flack  . 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 Uterine fibroid     PAST SURGICAL HISTORY :   Past Surgical History:  Procedure Laterality Date  . BREAST CYST ASPIRATION Left 09/30/2016  FNA done at Dr. Dwyane Luo office per notes   . BUNIONECTOMY Right 2017  . COLONOSCOPY  2014   Verdie Shire, [place] Right 05/08/2015   Procedure: LAPIPLASTY 1ST METATARSAL GREAT TOE;  Surgeon: Albertine Patricia, DPM;  Location: [facility];  Service: Podiatry;  Laterality: Right;  . KNEE ARTHROSCOPY Right 05/03  . MASTECTOMY Right 09/09/00   09/09/00-Dr Pat Patrick mastectomy/chemo  . OOPHORECTOMY Right 1990   right tube and ovary removed  . SALPINGECTOMY Right 1990  . SOFT TISSUE TUMOR RESECTION Left 7/09, 6/11   gluteal    FAMILY HISTORY :   Family History  Problem Relation Age of Onset  . Breast cancer Sister 46       2004, 2011  . Heart disease Sister   . Diabetes Father   . Hypertension Father   . Heart disease Brother   . Uterine cancer Sister 61  . Diabetes Sister     SOCIAL HISTORY:   Social History   Tobacco Use  . Smoking status: Never Smoker  . Smokeless tobacco: Never Used  Substance Use Topics  . Alcohol use: No    Alcohol/week: 0.0 oz  . Drug use: No    ALLERGIES:  has No Known Allergies.  MEDICATIONS:  Current Outpatient Medications  Medication Sig Dispense Refill  . metFORMIN (GLUCOPHAGE) 500 MG tablet Take 500 mg by mouth 2 (two) times daily.    Marland Kitchen omeprazole (PRILOSEC) 20 MG capsule Take 20 mg by mouth daily.     Marland Kitchen pyridOXINE (VITAMIN B-6) 100 MG tablet Take 1 tablet by mouth daily.    . simvastatin (ZOCOR) 20 MG tablet Take 20 mg by mouth daily.     . vitamin B-12 (CYANOCOBALAMIN) 1000 MCG tablet Take 1 tablet by mouth daily.    Marland Kitchen acetaminophen (TYLENOL) 325 MG tablet Take 650 mg by mouth every 6 (six) hours as needed.    . Calcium Carbonate-Vitamin D (CALCIUM + D PO) Take 1 tablet by mouth daily. CALCITRATE/VITAMIN D 315-200 MG-UNIT TABSGeneric: CALCIUM CITRATE-VITAMIN Dtwo by mouth every morning and one by mouth every evening     . cyclobenzaprine (FLEXERIL) 5 MG tablet Take 5 mg by mouth 3 (three) times daily as needed.     . hydrochlorothiazide (HYDRODIURIL) 25 MG tablet 25 mg.    . levothyroxine (SYNTHROID, LEVOTHROID) 125 MCG tablet Take 125 mcg by mouth daily before breakfast.    . Magnesium Gluconate 27.5 MG TABS Take 250 mg by mouth daily.       No current facility-administered medications for this visit.     PHYSICAL EXAMINATION: ECOG PERFORMANCE STATUS: 0 - Asymptomatic  BP 115/78 (BP Location: Left Arm, Patient Position: Sitting)   Pulse 84   Temp (!) 97 F (36.1 C) (Tympanic)   Resp 16   Wt 247 lb (112 kg)   BMI 36.48 kg/m   Filed Weights   08/23/17 0835 08/23/17 0837  Weight: 247 lb 0.4 oz (112.1 kg) 247 lb (112 kg)    GENERAL: Well-nourished well-developed; Alert, no distress and comfortable.  Alone.  EYES: no pallor or icterus OROPHARYNX: no thrush or ulceration; NECK: supple; no lymph nodes felt. LYMPH:  no palpable lymphadenopathy in the axillary or inguinal regions LUNGS: Decreased breath sounds auscultation bilaterally. No wheeze or crackles HEART/CVS: regular rate & rhythm and no murmurs; No lower extremity edema ABDOMEN:abdomen soft, non-tender and normal bowel sounds. No hepatomegaly or splenomegaly.  Musculoskeletal:no cyanosis of digits and no clubbing  PSYCH:  alert & oriented x 3 with fluent speech NEURO: no focal motor/sensory deficits SKIN:  no rashes or significant lesions    LABORATORY DATA:  I have reviewed the data as listed    Component Value Date/Time   NA 138 08/24/2016 0851   NA 137 08/03/2013 0838   K 4.2 08/24/2016 0851   K 3.4 (L) 08/03/2013 0838   CL 99 (L) 08/24/2016 0851   CL 98 08/03/2013 0838   CO2 31 08/24/2016 0851   CO2 32 08/03/2013 0838   GLUCOSE 175 (H) 08/24/2016 0851   GLUCOSE 121 (H) 08/03/2013 0838   BUN 20 08/24/2016 0851   BUN 12 08/03/2013 0838   CREATININE 0.79 08/24/2016 0851   CREATININE 0.90 08/03/2013 0838   CALCIUM 9.8 08/24/2016 0851   CALCIUM 9.5 08/03/2013 0838   PROT 7.7 08/24/2016 0851   PROT 7.9 08/03/2013 0838   ALBUMIN 4.2 08/24/2016 0851   ALBUMIN 3.8 08/03/2013 0838   AST 17 08/24/2016 0851   AST 18 08/03/2013 0838   ALT 14 08/24/2016 0851   ALT 23 08/03/2013 0838   ALKPHOS 89 08/24/2016 0851   ALKPHOS 115 08/03/2013 0838    BILITOT 0.6 08/24/2016 0851   BILITOT 0.4 08/03/2013 0838   GFRNONAA >60 08/24/2016 0851   GFRNONAA >60 08/03/2013 0838   GFRAA >60 08/24/2016 0851   GFRAA >60 08/03/2013 0838    No results found for: SPEP, UPEP  Lab Results  Component Value Date   WBC 5.1 08/23/2017   NEUTROABS 3.7 08/23/2017   HGB 12.3 08/23/2017   HCT 37.6 08/23/2017   MCV 77.7 (L) 08/23/2017   PLT 271 08/23/2017      Chemistry      Component Value Date/Time   NA 138 08/24/2016 0851   NA 137 08/03/2013 0838   K 4.2 08/24/2016 0851   K 3.4 (L) 08/03/2013 0838   CL 99 (L) 08/24/2016 0851   CL 98 08/03/2013 0838   CO2 31 08/24/2016 0851   CO2 32 08/03/2013 0838   BUN 20 08/24/2016 0851   BUN 12 08/03/2013 0838   CREATININE 0.79 08/24/2016 0851   CREATININE 0.90 08/03/2013 0838      Component Value Date/Time   CALCIUM 9.8 08/24/2016 0851   CALCIUM 9.5 08/03/2013 0838   ALKPHOS 89 08/24/2016 0851   ALKPHOS 115 08/03/2013 0838   AST 17 08/24/2016 0851   AST 18 08/03/2013 0838   ALT 14 08/24/2016 0851   ALT 23 08/03/2013 0838   BILITOT 0.6 08/24/2016 0851   BILITOT 0.4 08/03/2013 0838       RADIOGRAPHIC STUDIES: I have personally reviewed the radiological images as listed and agreed with the findings in the report. No results found.   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  Bone scan negative Jan 2019.  Continue surveillance at [facility]  # Bilateral lung nodules-likely secondary to upper sarcoma; no biopsy done 1.3-1.7 centimeters in size [most recent x-ray may 2019- STABLE; followed by Dr. Judee Clara.   # Microcytosis-normal hemoglobin.  Recommend p.o. iron once a day.  # Triple positive right breast cancer [2004]- stage II; stable no evidence of recurrence; screening left mammogram in  aug-sep 2019.   #Newly diagnosed diabetes on metformin; stable.  # Follow-up in one year; labs.       Orders Placed This Encounter  Procedures  .  MM DIAG BREAST TOMO UNI LEFT    Standing Status:   Future    Standing Expiration Date:   08/24/2018    Order Specific Question:   Reason for Exam (SYMPTOM  OR DIAGNOSIS REQUIRED)    Answer:   Breast cancer    Order Specific Question:   Preferred imaging location?    Answer:   [facility]  . MM 3D SCREEN BREAST UNI LEFT    Standing Status:   Future    Standing Expiration Date:   10/24/2018    Order Specific Question:   Reason for Exam (SYMPTOM  OR DIAGNOSIS REQUIRED)    Answer:   h/o breast cancer    Order Specific Question:   Preferred imaging location?    Answer:   [facility]   All questions were answered. The patient knows to call the clinic with any problems, questions or concerns.      Cammie Sickle, MD 08/28/2017 2:09 PM

## 2017-10-03 ENCOUNTER — Ambulatory Visit
Admission: RE | Admit: 2017-10-03 | Discharge: 2017-10-03 | Disposition: A | Discharge status: Home / Self Care | Payer: Medicare Other | Source: Ambulatory Visit | Attending: Internal Medicine | Admitting: Internal Medicine

## 2017-10-03 DIAGNOSIS — Z1231 Encounter for screening mammogram for malignant neoplasm of breast: Secondary | ICD-10-CM | POA: Diagnosis present

## 2017-10-03 DIAGNOSIS — Z853 Personal history of malignant neoplasm of breast: Secondary | ICD-10-CM | POA: Diagnosis not present

## 2017-10-03 HISTORY — DX: Personal history of antineoplastic chemotherapy: Z92.21

## 2017-10-18 ENCOUNTER — Telehealth: Payer: Self-pay | Admitting: *Deleted

## 2017-10-18 NOTE — Telephone Encounter (Signed)
Patient called and states that a prescription for mastectomy bras and a prosthesis was to be sent to [facility] over a month ago and claims it was never sent. Please and order and notify her when done [phone]

## 2017-10-19 NOTE — Telephone Encounter (Signed)
Per Jerene Pitch, this was taken care of yesterday

## 2018-01-09 DIAGNOSIS — S52501D Unspecified fracture of the lower end of right radius, subsequent encounter for closed fracture with routine healing: Secondary | ICD-10-CM | POA: Insufficient documentation

## 2018-01-31 ENCOUNTER — Encounter: Payer: Self-pay | Admitting: *Deleted

## 2018-02-01 ENCOUNTER — Other Ambulatory Visit: Payer: Self-pay

## 2018-02-01 ENCOUNTER — Encounter
Admission: RE | Disposition: A | Discharge status: Home / Self Care | Payer: Self-pay | Source: Ambulatory Visit | Attending: Internal Medicine

## 2018-02-01 ENCOUNTER — Ambulatory Visit: Payer: Medicare Other | Admitting: Anesthesiology

## 2018-02-01 ENCOUNTER — Ambulatory Visit
Admission: RE | Admit: 2018-02-01 | Discharge: 2018-02-01 | Disposition: A | Discharge status: Home / Self Care | Payer: Medicare Other | Source: Ambulatory Visit | Attending: Internal Medicine | Admitting: Internal Medicine

## 2018-02-01 DIAGNOSIS — Z79899 Other long term (current) drug therapy: Secondary | ICD-10-CM | POA: Insufficient documentation

## 2018-02-01 DIAGNOSIS — Z7984 Long term (current) use of oral hypoglycemic drugs: Secondary | ICD-10-CM | POA: Diagnosis not present

## 2018-02-01 DIAGNOSIS — E119 Type 2 diabetes mellitus without complications: Secondary | ICD-10-CM | POA: Insufficient documentation

## 2018-02-01 DIAGNOSIS — Z1211 Encounter for screening for malignant neoplasm of colon: Secondary | ICD-10-CM | POA: Insufficient documentation

## 2018-02-01 DIAGNOSIS — I10 Essential (primary) hypertension: Secondary | ICD-10-CM | POA: Insufficient documentation

## 2018-02-01 DIAGNOSIS — E039 Hypothyroidism, unspecified: Secondary | ICD-10-CM | POA: Insufficient documentation

## 2018-02-01 DIAGNOSIS — D125 Benign neoplasm of sigmoid colon: Secondary | ICD-10-CM | POA: Diagnosis not present

## 2018-02-01 DIAGNOSIS — Z86010 Personal history of colonic polyps: Secondary | ICD-10-CM | POA: Insufficient documentation

## 2018-02-01 DIAGNOSIS — K640 First degree hemorrhoids: Secondary | ICD-10-CM | POA: Insufficient documentation

## 2018-02-01 HISTORY — PX: COLONOSCOPY WITH PROPOFOL: SHX5780

## 2018-02-01 LAB — GLUCOSE, CAPILLARY: Glucose-Capillary: 85 mg/dL (ref 70–99)

## 2018-02-01 SURGERY — COLONOSCOPY WITH PROPOFOL
Anesthesia: General

## 2018-02-01 MED ORDER — PROPOFOL 10 MG/ML IV BOLUS
INTRAVENOUS | Status: DC | PRN
  Administered 2018-02-01: 90 mg via INTRAVENOUS

## 2018-02-01 MED ORDER — PROPOFOL 500 MG/50ML IV EMUL
INTRAVENOUS | Status: DC | PRN
  Administered 2018-02-01: 150 ug/kg/min via INTRAVENOUS

## 2018-02-01 MED ORDER — LIDOCAINE HCL (CARDIAC) PF 100 MG/5ML IV SOSY
PREFILLED_SYRINGE | INTRAVENOUS | Status: DC | PRN
  Administered 2018-02-01: 100 mg via INTRAVENOUS

## 2018-02-01 MED ORDER — PROPOFOL 10 MG/ML IV BOLUS
INTRAVENOUS | Status: AC
  Filled 2018-02-01: qty 20

## 2018-02-01 MED ORDER — SODIUM CHLORIDE 0.9 % IV SOLN
INTRAVENOUS | Status: DC
  Administered 2018-02-01: 07:00:00 via INTRAVENOUS

## 2018-02-01 NOTE — Anesthesia Post-op Follow-up Note (Signed)
Anesthesia QCDR form completed.        

## 2018-02-01 NOTE — Interval H&P Note (Signed)
History and Physical Interval Note:  02/01/2018 7:59 AM  Danielle Hansen  has presented today for surgery, with the diagnosis of PERSONAL HX OF COLON POLYPS  The various methods of treatment have been discussed with the patient and family. After consideration of risks, benefits and other options for treatment, the patient has consented to  Procedure(s): COLONOSCOPY WITH PROPOFOL (N/A) as a surgical intervention .  The patient's history has been reviewed, patient examined, no change in status, stable for surgery.  I have reviewed the patient's chart and labs.  Questions were answered to the patient's satisfaction.     [place], [place]

## 2018-02-01 NOTE — Transfer of Care (Signed)
Immediate Anesthesia Transfer of Care Note  Patient: Danielle Hansen  Procedure(s) Performed: COLONOSCOPY WITH PROPOFOL (N/A )  Patient Location: Endoscopy Unit  Anesthesia Type:General  Level of Consciousness: sedated  Airway & Oxygen Therapy: Patient Spontanous Breathing and Patient connected to nasal cannula oxygen  Post-op Assessment: Report given to RN and Post -op Vital signs reviewed and stable  Post vital signs: Reviewed and stable  Last Vitals:  Vitals Value Taken Time  BP 104/51 02/01/2018  8:26 AM  Temp 36.3 C 02/01/2018  8:20 AM  Pulse 68 02/01/2018  8:27 AM  Resp 15 02/01/2018  8:27 AM  SpO2 100 % 02/01/2018  8:27 AM  Vitals shown include unvalidated device data.  Last Pain:  Vitals:   02/01/18 0820  TempSrc: Tympanic         Complications: No apparent anesthesia complications

## 2018-02-01 NOTE — Anesthesia Preprocedure Evaluation (Signed)
Anesthesia Evaluation  Patient identified by MRN, date of birth, ID band Patient awake    Reviewed: Allergy & Precautions, H&P , NPO status , Patient's Chart, lab work & pertinent test results, reviewed documented beta blocker date and time   History of Anesthesia Complications Negative for: history of anesthetic complications  Airway Mallampati: I  TM Distance: >3 FB Neck ROM: full    Dental  (+) Caps, Dental Advidsory Given, Teeth Intact   Pulmonary neg pulmonary ROS,           Cardiovascular Exercise Tolerance: Good hypertension, (-) angina(-) CAD, (-) Past MI, (-) Cardiac Stents and (-) CABG (-) dysrhythmias (-) Valvular Problems/Murmurs     Neuro/Psych negative neurological ROS  negative psych ROS   GI/Hepatic Neg liver ROS, GERD  ,  Endo/Other  diabetesHypothyroidism   Renal/GU negative Renal ROS  negative genitourinary   Musculoskeletal   Abdominal   Peds  Hematology negative hematology ROS (+)   Anesthesia Other Findings Past Medical History: No date: Arthritis 2002: Breast cancer ([place])     Comment:  rt mastectomy/chemo 09/09/00: Cancer ([place])     Comment:  rt mastectomy-09/09/00 2009/2011: Cancer ([place])     Comment:  had 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 /[facility]/              Dr Ward No date: Degenerative disc disease, cervical     Comment:  no limitations No date: Diabetes mellitus without complication (HCC) No date: GERD (gastroesophageal reflux disease) No date: Hypertension No date: Hypothyroid No date: Lung nodule     Comment:  Dr Kendall Flack No date: Personal history of chemotherapy 2009 & 2011: Soft tissue sarcoma of pelvis ([place]) No date: Uterine fibroid   Reproductive/Obstetrics negative OB ROS                             Anesthesia Physical Anesthesia Plan  ASA: III  Anesthesia Plan: General   Post-op Pain Management:    Induction: Intravenous  PONV  Risk Score and Plan: 3 and Propofol infusion and TIVA  Airway Management Planned: Natural Airway and Nasal Cannula  Additional Equipment:   Intra-op Plan:   Post-operative Plan:   Informed Consent: I have reviewed the patients History and Physical, chart, labs and discussed the procedure including the risks, benefits and alternatives for the proposed anesthesia with the patient or authorized representative who has indicated his/her understanding and acceptance.   Dental Advisory Given  Plan Discussed with: Anesthesiologist, CRNA and Surgeon  Anesthesia Plan Comments:         Anesthesia Quick Evaluation

## 2018-02-01 NOTE — H&P (Signed)
Outpatient short stay form Pre-procedure 02/01/2018 7:59 AM Danielle Hansen K. Alice Reichert, M.D.  Primary Physician: Ezequiel Kayser, M.D.  Reason for visit:  Personal hx of colon polyps  History of present illness:                            Patient presents for colonoscopy for a personal hx of colon polyps. The patient denies abdominal pain, abnormal weight loss or rectal bleeding.     Current Facility-Administered Medications:  .  0.9 %  sodium chloride infusion, , Intravenous, Continuous, [place], Benay Pike, MD, Last Rate: 20 mL/hr at 02/01/18 0725  Medications Prior to Admission  Medication Sig Dispense Refill Last Dose  . acetaminophen (TYLENOL) 325 MG tablet Take 650 mg by mouth every 6 (six) hours as needed.   Taking  . cyclobenzaprine (FLEXERIL) 5 MG tablet Take 5 mg by mouth 3 (three) times daily as needed.    Taking  . simvastatin (ZOCOR) 20 MG tablet Take 20 mg by mouth daily.    01/31/2018 at Unknown time  . vitamin B-12 (CYANOCOBALAMIN) 1000 MCG tablet Take 1 tablet by mouth daily.   01/31/2018 at Unknown time  . Calcium Carbonate-Vitamin D (CALCIUM + D PO) Take 1 tablet by mouth daily. CALCITRATE/VITAMIN D 315-200 MG-UNIT TABSGeneric: CALCIUM CITRATE-VITAMIN Dtwo by mouth every morning and one by mouth every evening    Taking  . hydrochlorothiazide (HYDRODIURIL) 25 MG tablet 25 mg.   Taking  . levothyroxine (SYNTHROID, LEVOTHROID) 125 MCG tablet Take 125 mcg by mouth daily before breakfast.   Taking  . Magnesium Gluconate 27.5 MG TABS Take 250 mg by mouth daily.    Not Taking at Unknown time  . metFORMIN (GLUCOPHAGE) 500 MG tablet Take 500 mg by mouth 2 (two) times daily.   01/30/2018  . omeprazole (PRILOSEC) 20 MG capsule Take 20 mg by mouth daily.    Taking  . pyridOXINE (VITAMIN B-6) 100 MG tablet Take 1 tablet by mouth daily.   Not Taking at Unknown time     No Known Allergies   Past Medical History:  Diagnosis Date  . Arthritis   . Breast cancer ([place]) 2002   rt mastectomy/chemo   . Cancer ([place]) 09/09/00   rt mastectomy-09/09/00  . 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 Degenerative disc disease, cervical    no limitations  . Diabetes mellitus without complication ([place])   . GERD (gastroesophageal reflux disease)   . Hypertension   . Hypothyroid   . Lung nodule    Dr Kendall Flack  . Personal history of chemotherapy   . 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 Uterine fibroid     Review of systems:  Otherwise negative.    Physical Exam  Gen: Alert, oriented. Appears stated age.  HEENT: [place]/AT. PERRLA. Lungs: CTA, no wheezes. CV: RR nl S1, S2. Abd: soft, benign, no masses. BS+ Ext: No edema. Pulses 2+    Planned procedures: Proceed with colonoscopy. The patient understands the nature of the planned procedure, indications, risks, alternatives and potential complications including but not limited to bleeding, infection, perforation, damage to internal organs and possible oversedation/side effects from anesthesia. The patient agrees and gives consent to proceed.  Please refer to procedure notes for findings, recommendations and patient disposition/instructions.     Jalaiyah Throgmorton K. Alice Reichert, M.D. Gastroenterology 02/01/2018  7:59 AM

## 2018-02-02 ENCOUNTER — Encounter: Payer: Self-pay | Admitting: Internal Medicine

## 2018-02-02 LAB — SURGICAL PATHOLOGY

## 2018-02-02 NOTE — Anesthesia Postprocedure Evaluation (Signed)
Anesthesia Post Note  Patient: Danielle Hansen  Procedure(s) Performed: COLONOSCOPY WITH PROPOFOL (N/A )  Patient location during evaluation: Endoscopy Anesthesia Type: General Level of consciousness: awake and alert Pain management: pain level controlled Vital Signs Assessment: post-procedure vital signs reviewed and stable Respiratory status: spontaneous breathing, nonlabored ventilation, respiratory function stable and patient connected to nasal cannula oxygen Cardiovascular status: blood pressure returned to baseline and stable Postop Assessment: no apparent nausea or vomiting Anesthetic complications: no     Last Vitals:  Vitals:   02/01/18 0840 02/01/18 0850  BP: 126/74 136/79  Pulse: 82 64  Resp: 19 20  Temp:    SpO2: 100% 100%    Last Pain:  Vitals:   02/02/18 0731  TempSrc:   PainSc: 0-No pain                 Martha Clan

## 2018-02-02 NOTE — Op Note (Signed)
[facility] Gastroenterology Patient Name: Danielle Hansen Procedure Date: 02/01/2018 7:40 AM MRN: [id number] Account #: [id number] Date of Birth: February 02, 1951 Admit Type: Outpatient Age: 67 Room: [facility] ENDO ROOM 3 Gender: Female Note Status: Finalized Procedure:            Colonoscopy Indications:          High risk colon cancer surveillance: Personal history                        of colonic polyps Providers:            Benay Pike. Alice Reichert MD, MD Referring MD:         Christena Flake. Raechel Ache, MD (Referring MD) Medicines:            Propofol per Anesthesia Complications:        No immediate complications. Procedure:            Pre-Anesthesia Assessment:                       - The risks and benefits of the procedure and the                        sedation options and risks were discussed with the                        patient. All questions were answered and informed                        consent was obtained.                       - Patient identification and proposed procedure were                        verified prior to the procedure by the nurse. The                        procedure was verified in the procedure room.                       - ASA Grade Assessment: III - A patient with severe                        systemic disease.                       - After reviewing the risks and benefits, the patient                        was deemed in satisfactory condition to undergo the                        procedure.                       After obtaining informed consent, the colonoscope was                        passed under direct vision. Throughout the procedure,  the patient's blood pressure, pulse, and oxygen                        saturations were monitored continuously. The                        Colonoscope was introduced through the anus and                        advanced to the the cecum, identified by appendiceal   orifice and ileocecal valve. The colonoscopy was                        performed without difficulty. The patient tolerated the                        procedure well. The quality of the bowel preparation                        was good. The ileocecal valve, appendiceal orifice, and                        rectum were photographed. Findings:      The perianal and digital rectal examinations were normal. Pertinent       negatives include normal sphincter tone and no palpable rectal lesions.      A 4 mm polyp was found in the proximal sigmoid colon. The polyp was       sessile. The polyp was removed with a jumbo cold forceps. Resection and       retrieval were complete.      Non-bleeding internal hemorrhoids were found during retroflexion. The       hemorrhoids were Grade I (internal hemorrhoids that do not prolapse).      The exam was otherwise without abnormality. Impression:           - One 4 mm polyp in the proximal sigmoid colon, removed                        with a jumbo cold forceps. Resected and retrieved.                       - Non-bleeding internal hemorrhoids.                       - The examination was otherwise normal. Recommendation:       - Patient has a contact number available for                        emergencies. The signs and symptoms of potential                        delayed complications were discussed with the patient.                        Return to normal activities tomorrow. Written discharge                        instructions were provided to the patient.                       -  Resume previous diet.                       - Continue present medications.                       - Repeat colonoscopy in 5 years for surveillance.                       - Return to GI office PRN. Procedure Code(s):    --- Professional ---                       [phone], Colonoscopy, flexible; with biopsy, single or                        multiple Diagnosis Code(s):    --- Professional  ---                       K64.0, First degree hemorrhoids                       D12.5, Benign neoplasm of sigmoid colon                       Z86.010, Personal history of colonic polyps CPT copyright 2018 American Medical Association. All rights reserved. The codes documented in this report are preliminary and upon coder review may  be revised to meet current compliance requirements. Efrain Sella MD, MD 02/01/2018 8:23:46 AM This report has been signed electronically. Number of Addenda: 0 Note Initiated On: 02/01/2018 7:40 AM Scope Withdrawal Time: 0 hours 10 minutes 33 seconds  Total Procedure Duration: 0 hours 13 minutes 11 seconds       [facility]

## 2018-03-02 ENCOUNTER — Encounter: Payer: Self-pay | Admitting: Obstetrics and Gynecology

## 2018-03-02 ENCOUNTER — Ambulatory Visit (INDEPENDENT_AMBULATORY_CARE_PROVIDER_SITE_OTHER): Payer: Medicare Other | Admitting: Obstetrics and Gynecology

## 2018-03-02 VITALS — BP 120/81 | HR 75 | Ht 69.0 in | Wt 210.3 lb

## 2018-03-02 DIAGNOSIS — Z01419 Encounter for gynecological examination (general) (routine) without abnormal findings: Secondary | ICD-10-CM

## 2018-03-02 DIAGNOSIS — Z853 Personal history of malignant neoplasm of breast: Secondary | ICD-10-CM

## 2018-03-02 NOTE — Progress Notes (Signed)
Subjective:   Danielle Hansen is a 68 y.o. G31P1 Caucasian female here for a routine well-woman exam.  No LMP recorded. Patient is postmenopausal.    Current complaints: none gynecology, has lost 70#s in last year- changed diet due to diabetes diagnosis. PCP: Dorthula Perfect       doesn't desire labs  Social History: Sexual: heterosexual Marital Status: married Living situation: with spouse Occupation: retired Tobacco/alcohol: no tobacco use Illicit drugs: no history of illicit drug use  The following portions of the patient's history were reviewed and updated as appropriate: allergies, current medications, past family history, past medical history, past social history, past surgical history and problem list.  Past Medical History Past Medical History:  Diagnosis Date  . Arthritis   . Breast cancer ([place]) 2002   rt mastectomy/chemo  . Cancer ([place]) 09/09/00   rt mastectomy-09/09/00  . 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 Degenerative disc disease, cervical    no limitations  . Diabetes mellitus without complication ([place])   . GERD (gastroesophageal reflux disease)   . Hypertension   . Hypothyroid   . Lung nodule    Dr Kendall Flack  . Personal history of chemotherapy   . 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 Uterine fibroid     Past Surgical History Past Surgical History:  Procedure Laterality Date  . BREAST CYST ASPIRATION Left 09/30/2016   FNA done at Dr. Dwyane Luo office per notes   . BUNIONECTOMY Right 2017  . COLONOSCOPY  2014   Verdie Shire, MD  . COLONOSCOPY WITH PROPOFOL N/A 02/01/2018   Procedure: COLONOSCOPY WITH PROPOFOL;  Surgeon: Toledo, Benay Pike, MD;  Location: [facility] ENDOSCOPY;  Service: Gastroenterology;  Laterality: N/A;  . FRACTURE SURGERY Right 12/2016   [facility]  . HALLUX VALGUS LAPIDUS Right 05/08/2015   Procedure: LAPIPLASTY 1ST METATARSAL GREAT TOE;  Surgeon: Albertine Patricia, DPM;  Location: [facility];  Service: Podiatry;  Laterality: Right;  . KNEE ARTHROSCOPY Right 05/03  . MASTECTOMY Right 09/09/00   09/09/00-Dr Pat Patrick mastectomy/chemo  . OOPHORECTOMY Right 1990   right tube and ovary removed  . SALPINGECTOMY Right 1990  . SOFT TISSUE TUMOR RESECTION Left 7/09, 6/11   gluteal    Gynecologic History G1P1  No LMP recorded. Patient is postmenopausal. Contraception: post menopausal status Last Pap: 2016. Results were: normal Last mammogram: 09/2017. Results were: normal   Obstetric History OB History  Gravida Para Term Preterm AB Living  1 1          SAB TAB Ectopic Multiple Live Births               # Outcome Date GA Lbr Len/2nd Weight Sex Delivery Anes PTL Lv  1 Para             Obstetric Comments  1st Menstrual Cycle:  13  1st Pregnancy:  25    Current Medications Current Outpatient Medications on File Prior to Visit  Medication Sig Dispense Refill  . acetaminophen (TYLENOL) 325 MG tablet Take 650 mg by mouth every 6 (six) hours as needed.    . Calcium Carbonate-Vitamin D (CALCIUM + D PO) Take 1 tablet by mouth daily. CALCITRATE/VITAMIN D 315-200 MG-UNIT TABSGeneric: CALCIUM CITRATE-VITAMIN Dtwo by mouth every morning and one by mouth every evening     . cyclobenzaprine (FLEXERIL) 5 MG tablet Take 5 mg by mouth 3 (three) times daily as needed.     Marland Kitchen  hydrochlorothiazide (HYDRODIURIL) 25 MG tablet 25 mg.    . levothyroxine (SYNTHROID, LEVOTHROID) 125 MCG tablet Take 125 mcg by mouth daily before breakfast.    . metFORMIN (GLUCOPHAGE) 500 MG tablet Take 500 mg by mouth 2 (two) times daily.    Marland Kitchen omeprazole (PRILOSEC) 20 MG capsule Take 20 mg by mouth daily.     . simvastatin (ZOCOR) 20 MG tablet Take 20 mg by mouth daily.     . vitamin B-12 (CYANOCOBALAMIN) 1000 MCG tablet Take 1 tablet by mouth daily.    . Magnesium Gluconate 27.5 MG TABS Take 250 mg by mouth daily.     Marland Kitchen pyridOXINE (VITAMIN B-6) 100 MG tablet Take 1 tablet by mouth daily.     No current  facility-administered medications on file prior to visit.     Review of Systems Patient denies any headaches, blurred vision, shortness of breath, chest pain, abdominal pain, problems with bowel movements, urination, or intercourse.  Objective:  BP 120/81   Pulse 75   Ht 5\' 9"  (1.753 m)   Wt 210 lb 4.8 oz (95.4 kg)   BMI 31.06 kg/m  Physical Exam  General:  Well developed, well nourished, no acute distress. She is alert and oriented x3. Skin:  Warm and dry Neck:  Midline trachea, no thyromegaly or nodules Cardiovascular: Regular rate and rhythm, no murmur heard Lungs:  Effort normal, all lung fields clear to auscultation bilaterally Breasts:  No dominant palpable mass, retraction, or nipple discharge in left breast, right breast s/p mastectomy. Abdomen:  Soft, non tender, no hepatosplenomegaly or masses Pelvic:  External genitalia is normal in appearance.  The vagina is normal in appearance. The cervix is bulbous, no CMT.  Thin prep pap is not done. Uterus is felt to be normal size, shape, and contour.  No adnexal masses or tenderness noted. Extremities:  No swelling or varicosities noted Psych:  She has a normal mood and affect  Assessment:   Healthy well-woman exam DM S/p breast cancer  Plan:   F/U 1 year for AE Mammogram UTD Colonoscopy UTD  Melody Rockney Ghee, CNM

## 2018-08-22 ENCOUNTER — Inpatient Hospital Stay: Payer: Medicare Other | Admitting: Internal Medicine

## 2018-08-22 ENCOUNTER — Inpatient Hospital Stay: Payer: Medicare Other

## 2018-08-22 ENCOUNTER — Ambulatory Visit: Payer: Medicare Other | Admitting: Internal Medicine

## 2018-08-22 ENCOUNTER — Other Ambulatory Visit: Payer: Medicare Other

## 2018-09-04 ENCOUNTER — Other Ambulatory Visit: Payer: Self-pay

## 2018-09-04 DIAGNOSIS — C50811 Malignant neoplasm of overlapping sites of right female breast: Secondary | ICD-10-CM

## 2018-09-05 ENCOUNTER — Inpatient Hospital Stay: Payer: Medicare Other | Attending: Internal Medicine

## 2018-09-05 ENCOUNTER — Inpatient Hospital Stay (HOSPITAL_BASED_OUTPATIENT_CLINIC_OR_DEPARTMENT_OTHER): Payer: Medicare Other | Admitting: Internal Medicine

## 2018-09-05 ENCOUNTER — Other Ambulatory Visit: Payer: Self-pay

## 2018-09-05 VITALS — BP 118/78 | HR 77 | Temp 95.6°F | Resp 20 | Ht 69.0 in | Wt 199.0 lb

## 2018-09-05 DIAGNOSIS — I10 Essential (primary) hypertension: Secondary | ICD-10-CM

## 2018-09-05 DIAGNOSIS — C50811 Malignant neoplasm of overlapping sites of right female breast: Secondary | ICD-10-CM

## 2018-09-05 DIAGNOSIS — Z79899 Other long term (current) drug therapy: Secondary | ICD-10-CM | POA: Insufficient documentation

## 2018-09-05 DIAGNOSIS — C419 Malignant neoplasm of bone and articular cartilage, unspecified: Secondary | ICD-10-CM | POA: Diagnosis not present

## 2018-09-05 DIAGNOSIS — Z7984 Long term (current) use of oral hypoglycemic drugs: Secondary | ICD-10-CM

## 2018-09-05 DIAGNOSIS — Z803 Family history of malignant neoplasm of breast: Secondary | ICD-10-CM | POA: Diagnosis not present

## 2018-09-05 DIAGNOSIS — E039 Hypothyroidism, unspecified: Secondary | ICD-10-CM | POA: Insufficient documentation

## 2018-09-05 DIAGNOSIS — C499 Malignant neoplasm of connective and soft tissue, unspecified: Secondary | ICD-10-CM | POA: Insufficient documentation

## 2018-09-05 DIAGNOSIS — Z170 Estrogen receptor positive status [ER+]: Secondary | ICD-10-CM

## 2018-09-05 DIAGNOSIS — Z9011 Acquired absence of right breast and nipple: Secondary | ICD-10-CM | POA: Insufficient documentation

## 2018-09-05 DIAGNOSIS — E119 Type 2 diabetes mellitus without complications: Secondary | ICD-10-CM | POA: Diagnosis not present

## 2018-09-05 LAB — CBC WITH DIFFERENTIAL/PLATELET
Abs Immature Granulocytes: 0.01 10*3/uL (ref 0.00–0.07)
Basophils Absolute: 0 10*3/uL (ref 0.0–0.1)
Basophils Relative: 1 %
Eosinophils Absolute: 0.1 10*3/uL (ref 0.0–0.5)
Eosinophils Relative: 2 %
HCT: 41.2 % (ref 36.0–46.0)
Hemoglobin: 13.4 g/dL (ref 12.0–15.0)
Immature Granulocytes: 0 %
Lymphocytes Relative: 25 %
Lymphs Abs: 1.1 10*3/uL (ref 0.7–4.0)
MCH: 27.7 pg (ref 26.0–34.0)
MCHC: 32.5 g/dL (ref 30.0–36.0)
MCV: 85.3 fL (ref 80.0–100.0)
Monocytes Absolute: 0.3 10*3/uL (ref 0.1–1.0)
Monocytes Relative: 7 %
Neutro Abs: 2.8 10*3/uL (ref 1.7–7.7)
Neutrophils Relative %: 65 %
Platelets: 226 10*3/uL (ref 150–400)
RBC: 4.83 MIL/uL (ref 3.87–5.11)
RDW: 13.1 % (ref 11.5–15.5)
WBC: 4.4 10*3/uL (ref 4.0–10.5)
nRBC: 0 % (ref 0.0–0.2)

## 2018-09-05 LAB — COMPREHENSIVE METABOLIC PANEL
ALT: 13 U/L (ref 0–44)
AST: 14 U/L — ABNORMAL LOW (ref 15–41)
Albumin: 4.2 g/dL (ref 3.5–5.0)
Alkaline Phosphatase: 75 U/L (ref 38–126)
Anion gap: 9 (ref 5–15)
BUN: 20 mg/dL (ref 8–23)
CO2: 30 mmol/L (ref 22–32)
Calcium: 9.7 mg/dL (ref 8.9–10.3)
Chloride: 101 mmol/L (ref 98–111)
Creatinine, Ser: 0.64 mg/dL (ref 0.44–1.00)
GFR calc Af Amer: >60 mL/min (ref >60)
GFR calc non Af Amer: >60 mL/min (ref >60)
Glucose, Bld: 107 mg/dL — ABNORMAL HIGH (ref 70–99)
Potassium: 3.8 mmol/L (ref 3.5–5.1)
Sodium: 140 mmol/L (ref 135–145)
Total Bilirubin: 0.6 mg/dL (ref 0.3–1.2)
Total Protein: 7.1 g/dL (ref 6.5–8.1)

## 2018-09-05 LAB — LACTATE DEHYDROGENASE: LDH: 116 U/L (ref 98–192)

## 2018-09-05 LAB — IRON AND TIBC
Iron: 74 ug/dL (ref 28–170)
Saturation Ratios: 21 % (ref 10.4–31.8)
TIBC: 355 ug/dL (ref 250–450)
UIBC: 281 ug/dL

## 2018-09-05 LAB — FERRITIN: Ferritin: 27 ng/mL (ref 11–307)

## 2018-09-05 NOTE — Progress Notes (Signed)
[facility] OFFICE PROGRESS NOTE  Patient Care Team: Ezequiel Kayser, MD as PCP - General (Internal Medicine) [place] Gleason, MD (Inactive) (Oncology) Bary Castilla [place] Gleason, MD (General Surgery)  Cancer Staging No matching staging information was found for the patient.   Oncology History Overview Note  Chief Complaint/Problem List  Breast cancer: T2N0M0, stage II. HER-2/neu 3 +. Estrogen receptor greater than 90%. Progesterone receptor greater than 90%.    Previous therapy:   1. Right mastectomy and axillary lymph node dissection- s/p. Adriamycin and Cytoxan.  3. Tamoxifen 20 mg daily  4. Vaginal bleeding on Tamoxifen in November 2004.   5. Aromasin 25 mg daily starting January 2005. 6. Finished aromosin   was seen in November of 2007, and has refused NSABP B. 42 study  7. Left thigh sarcoma, 2009 [[facility]; s/p Sarcoma spl; Dr.Savage; [facility]; followed by local recurrence s/p RT; Dr.Crystal; no chemo]; last MRI dec 2017- N[facility]   # 2015- Bil Lung nodules [1.3-1.7cm; CXR- April 2018; Dr.Savage]  -----------------------------------------------------  S/p resection ([place]) July 2009. 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 Focal margin positivity.Three benign appearing lymph nodes.  Surveillance plan: MRI q 6 month through 2014, then annually.  DIAGNOSIS: Extraskeletal myxoid chondrosarcoma /lung nodules   STAGE:   4      ;GOALS: Control  CURRENT/MOST RECENT THERAPY: Surveillance    Malignant neoplasm of skeletal system (HCC)  Carcinoma of overlapping sites of right breast in female, estrogen receptor positive ([place])      INTERVAL HISTORY:  Danielle Hansen 68 y.o.  female pleasant patient above history of sarcoma/lung nodules; also history of breast cancer is here for follow-up.  Patient continues to follow-up closely at [facility] for history of sarcoma.  I again reviewed the records/office  visits from March 2020.  Patient denies any worsening shortness of breath or cough.  Appetite is good.  She has about 81 pounds intentional weight loss given the recent diagnosis diabetes.  Recent A1c is 5.6.  Review of Systems  Constitutional: Negative for chills, diaphoresis, fever, malaise/fatigue and weight loss.  HENT: Negative for nosebleeds and sore throat.   Eyes: Negative for double vision.  Respiratory: Negative for cough, hemoptysis, sputum production, shortness of breath and wheezing.   Cardiovascular: Negative for chest pain, palpitations, orthopnea and leg swelling.  Gastrointestinal: Negative for abdominal pain, blood in stool, constipation, diarrhea, heartburn, melena, nausea and vomiting.  Genitourinary: Negative for dysuria, frequency and urgency.  Musculoskeletal: Negative for back pain and joint pain.  Skin: Negative.  Negative for itching and rash.  Neurological: Negative for dizziness, tingling, focal weakness, weakness and headaches.  Endo/Heme/Allergies: Does not bruise/bleed easily.  Psychiatric/Behavioral: Negative for depression. The patient is not nervous/anxious and does not have insomnia.       PAST MEDICAL HISTORY :  Past Medical History:  Diagnosis Date  . Arthritis   . Breast cancer ([place]) 2002   rt mastectomy/chemo  . Cancer ([place]) 09/09/00   rt mastectomy-09/09/00  . 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 Degenerative disc disease, cervical    no limitations  . Diabetes mellitus without complication ([place])   . GERD (gastroesophageal reflux disease)   . Hypertension   . Hypothyroid   . Lung nodule    Dr Kendall Flack  . Personal history of chemotherapy   . 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 Uterine fibroid  PAST SURGICAL HISTORY :   Past Surgical History:  Procedure Laterality Date  . BREAST CYST ASPIRATION Left 09/30/2016   FNA done at Dr. Dwyane Luo office per notes   .  BUNIONECTOMY Right 2017  . COLONOSCOPY  2014   Verdie Shire, MD  . COLONOSCOPY WITH PROPOFOL N/A 02/01/2018   Procedure: COLONOSCOPY WITH PROPOFOL;  Surgeon: Toledo, Benay Pike, MD;  Location: [facility] ENDOSCOPY;  Service: Gastroenterology;  Laterality: N/A;  . FRACTURE SURGERY Right 12/2016   [facility]  . HALLUX VALGUS LAPIDUS Right 05/08/2015   Procedure: LAPIPLASTY 1ST METATARSAL GREAT TOE;  Surgeon: Albertine Patricia, DPM;  Location: [place];  Service: Podiatry;  Laterality: Right;  . KNEE ARTHROSCOPY Right 05/03  . MASTECTOMY Right 09/09/00   09/09/00-Dr Pat Patrick mastectomy/chemo  . OOPHORECTOMY Right 1990   right tube and ovary removed  . SALPINGECTOMY Right 1990  . SOFT TISSUE TUMOR RESECTION Left 7/09, 6/11   gluteal    FAMILY HISTORY :   Family History  Problem Relation Age of Onset  . Breast cancer Sister 64       2004, 2011  . Heart disease Sister   . Diabetes Father   . Hypertension Father   . Heart disease Brother   . Uterine cancer Sister 51  . Diabetes Sister     SOCIAL HISTORY:   Social History   Tobacco Use  . Smoking status: Never Smoker  . Smokeless tobacco: Never Used  Substance Use Topics  . Alcohol use: No    Alcohol/week: 0.0 standard drinks  . Drug use: No    ALLERGIES:  has No Known Allergies.  MEDICATIONS:  Current Outpatient Medications  Medication Sig Dispense Refill  . acetaminophen (TYLENOL) 325 MG tablet Take 650 mg by mouth every 6 (six) hours as needed.    . Calcium Carbonate-Vitamin D (CALCIUM + D PO) Take 1 tablet by mouth daily. CALCITRATE/VITAMIN D 315-200 MG-UNIT TABSGeneric: CALCIUM CITRATE-VITAMIN Dtwo by mouth every morning and one by mouth every evening     . cyclobenzaprine (FLEXERIL) 5 MG tablet Take 5 mg by mouth 3 (three) times daily as needed.     . ferrous sulfate 325 (65 FE) MG EC tablet Take 325 mg by mouth 3 (three) times daily with meals.    . hydrochlorothiazide (HYDRODIURIL) 25 MG tablet 25 mg.    . levothyroxine (SYNTHROID,  LEVOTHROID) 125 MCG tablet Take 125 mcg by mouth daily before breakfast.    . metFORMIN (GLUCOPHAGE) 500 MG tablet Take 500 mg by mouth 2 (two) times daily.    Marland Kitchen omeprazole (PRILOSEC) 20 MG capsule Take 20 mg by mouth daily.     . simvastatin (ZOCOR) 20 MG tablet Take 20 mg by mouth daily.     . vitamin B-12 (CYANOCOBALAMIN) 1000 MCG tablet Take 1 tablet by mouth daily.     No current facility-administered medications for this visit.     PHYSICAL EXAMINATION: ECOG PERFORMANCE STATUS: 0 - Asymptomatic  BP 118/78 (BP Location: Left Arm, Patient Position: Sitting, Cuff Size: Normal)   Pulse 77   Temp (!) 95.6 F (35.3 C) (Tympanic)   Resp 20   Ht _0  (1.753 m)   Wt 199 lb (90.3 kg)   BMI 29.39 kg/m   Filed Weights   09/05/18 0830  Weight: 199 lb (90.3 kg)   Physical Exam  Constitutional: She is oriented to person, place, and time and well-developed, well-nourished, and in no distress.  HENT:  Head: Normocephalic and atraumatic.  Mouth/Throat:  Oropharynx is clear and moist. No oropharyngeal exudate.  Eyes: Pupils are equal, round, and reactive to light.  Neck: Normal range of motion. Neck supple.  Cardiovascular: Normal rate and regular rhythm.  Pulmonary/Chest: Effort normal and breath sounds normal. No respiratory distress. She has no wheezes.  Abdominal: Soft. Bowel sounds are normal. She exhibits no distension and no mass. There is no abdominal tenderness. There is no rebound and no guarding.  Musculoskeletal: Normal range of motion.        General: No tenderness or edema.  Neurological: She is alert and oriented to person, place, and time.  Skin: Skin is warm.  Psychiatric: Affect normal.     LABORATORY DATA:  I have reviewed the data as listed    Component Value Date/Time   NA 140 09/05/2018 0810   NA 137 08/03/2013 0838   K 3.8 09/05/2018 0810   K 3.4 (L) 08/03/2013 0838   CL 101 09/05/2018 0810   CL 98 08/03/2013 0838   CO2 30 09/05/2018 0810   CO2 32  08/03/2013 0838   GLUCOSE 107 (H) 09/05/2018 0810   GLUCOSE 121 (H) 08/03/2013 0838   BUN 20 09/05/2018 0810   BUN 12 08/03/2013 0838   CREATININE 0.64 09/05/2018 0810   CREATININE 0.90 08/03/2013 0838   CALCIUM 9.7 09/05/2018 0810   CALCIUM 9.5 08/03/2013 0838   PROT 7.1 09/05/2018 0810   PROT 7.9 08/03/2013 0838   ALBUMIN 4.2 09/05/2018 0810   ALBUMIN 3.8 08/03/2013 0838   AST 14 (L) 09/05/2018 0810   AST 18 08/03/2013 0838   ALT 13 09/05/2018 0810   ALT 23 08/03/2013 0838   ALKPHOS 75 09/05/2018 0810   ALKPHOS 115 08/03/2013 0838   BILITOT 0.6 09/05/2018 0810   BILITOT 0.4 08/03/2013 0838   GFRNONAA >60 09/05/2018 0810   GFRNONAA >60 08/03/2013 0838   GFRAA >60 09/05/2018 0810   GFRAA >60 08/03/2013 0838    No results found for: SPEP, UPEP  Lab Results  Component Value Date   WBC 4.4 09/05/2018   NEUTROABS 2.8 09/05/2018   HGB 13.4 09/05/2018   HCT 41.2 09/05/2018   MCV 85.3 09/05/2018   PLT 226 09/05/2018      Chemistry      Component Value Date/Time   NA 140 09/05/2018 0810   NA 137 08/03/2013 0838   K 3.8 09/05/2018 0810   K 3.4 (L) 08/03/2013 0838   CL 101 09/05/2018 0810   CL 98 08/03/2013 0838   CO2 30 09/05/2018 0810   CO2 32 08/03/2013 0838   BUN 20 09/05/2018 0810   BUN 12 08/03/2013 0838   CREATININE 0.64 09/05/2018 0810   CREATININE 0.90 08/03/2013 0838      Component Value Date/Time   CALCIUM 9.7 09/05/2018 0810   CALCIUM 9.5 08/03/2013 0838   ALKPHOS 75 09/05/2018 0810   ALKPHOS 115 08/03/2013 0838   AST 14 (L) 09/05/2018 0810   AST 18 08/03/2013 0838   ALT 13 09/05/2018 0810   ALT 23 08/03/2013 0838   BILITOT 0.6 09/05/2018 0810   BILITOT 0.4 08/03/2013 0838       RADIOGRAPHIC STUDIES: I have personally reviewed the radiological images as listed and agreed with the findings in the report. No results found.   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  Stable.  Ordered mammogram from August 2020.  #Diabetes well controlled-on oral hypoglycemic agents.  Congratulated patient on weight loss/lifestyle modification.  # DISPOSITION:  # Mammogram left screening in second week of august/ [facility].  # Follow-up-12 months-  MD/labs-cbc/cmp/ldh- Dr.B   No orders of the defined types were placed in this encounter.  All questions were answered. The patient knows to call the clinic with any problems, questions or concerns.      Cammie Sickle, MD 09/05/2018 9:13 AM

## 2018-09-05 NOTE — Assessment & Plan Note (Addendum)
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  Stable.  Ordered mammogram from August 2020.  #Diabetes well controlled-on oral hypoglycemic agents.  Congratulated patient on weight loss/lifestyle modification.  # DISPOSITION:  # Mammogram left screening in second week of august/ [facility].  # Follow-up-12 months-  MD/labs-cbc/cmp/ldh- Dr.B

## 2018-10-05 ENCOUNTER — Other Ambulatory Visit: Payer: Self-pay

## 2018-10-05 ENCOUNTER — Ambulatory Visit
Admission: RE | Admit: 2018-10-05 | Discharge: 2018-10-05 | Disposition: A | Discharge status: Home / Self Care | Payer: Medicare Other | Source: Ambulatory Visit | Attending: Internal Medicine | Admitting: Internal Medicine

## 2018-10-05 DIAGNOSIS — Z1231 Encounter for screening mammogram for malignant neoplasm of breast: Secondary | ICD-10-CM | POA: Diagnosis present

## 2018-10-05 DIAGNOSIS — C50811 Malignant neoplasm of overlapping sites of right female breast: Secondary | ICD-10-CM

## 2018-10-05 DIAGNOSIS — Z853 Personal history of malignant neoplasm of breast: Secondary | ICD-10-CM | POA: Diagnosis not present

## 2019-03-15 ENCOUNTER — Other Ambulatory Visit: Payer: Self-pay

## 2019-03-15 ENCOUNTER — Ambulatory Visit (INDEPENDENT_AMBULATORY_CARE_PROVIDER_SITE_OTHER): Payer: Medicare Other | Admitting: Obstetrics and Gynecology

## 2019-03-15 ENCOUNTER — Encounter: Payer: Self-pay | Admitting: Obstetrics and Gynecology

## 2019-03-15 ENCOUNTER — Other Ambulatory Visit (HOSPITAL_COMMUNITY)
Admission: RE | Admit: 2019-03-15 | Discharge: 2019-03-15 | Disposition: A | Discharge status: Home / Self Care | Payer: Medicare Other | Source: Ambulatory Visit | Attending: Obstetrics and Gynecology | Admitting: Obstetrics and Gynecology

## 2019-03-15 VITALS — BP 116/79 | HR 81 | Ht 69.0 in | Wt 203.1 lb

## 2019-03-15 DIAGNOSIS — Z124 Encounter for screening for malignant neoplasm of cervix: Secondary | ICD-10-CM

## 2019-03-15 DIAGNOSIS — Z01419 Encounter for gynecological examination (general) (routine) without abnormal findings: Secondary | ICD-10-CM | POA: Diagnosis present

## 2019-03-15 NOTE — Progress Notes (Signed)
HPI:      [name] is a 69 y.o. G1P1001 who LMP was No LMP recorded. Patient is postmenopausal.  Subjective:   She presents today for her annual examination.  She has no complaints.  She feels well. Significant history includes history of breast cancer right side more than 18 years ago.  Subsequent mastectomy. History also includes left hip/back sarcomatous lesion with 2 resections and subsequent radiation.  This seems to be resolved at this time as well. Patient has a PCP where she gets her lab work performed, she is up-to-date on mammograms and colonoscopies.    Hx: The following portions of the patient's history were reviewed and updated as appropriate:             She  has a past medical history of Arthritis, Breast cancer ([place]) (2002), Cancer ([place]) (09/09/00), Cancer ([place]) (2009/2011), Degenerative disc disease, cervical, Diabetes mellitus without complication ([place]), GERD (gastroesophageal reflux disease), Hypertension, Hypothyroid, Lung nodule, Personal history of chemotherapy, Soft tissue sarcoma of pelvis [facility]) (2009 & 2011), and Uterine fibroid. She does not have any pertinent problems on file. She  has a past surgical history that includes Salpingectomy (Right, 1990); Knee arthroscopy (Right, 05/03); Soft Tissue Tumor Resection (Left, 7/09, 6/11); Hallux valgus lapidus (Right, 05/08/2015); Bunionectomy (Right, 2017); Colonoscopy (2014); Oophorectomy (Right, 1990); Mastectomy (Right, 09/09/00); Breast cyst aspiration (Left, 09/30/2016); Fracture surgery (Right, 12/2016); and Colonoscopy with propofol (N/A, 02/01/2018). Her family history includes Breast cancer (age of onset: 49) in her sister; Diabetes in her father and sister; Heart disease in her brother and sister; Hypertension in her father; Uterine cancer (age of onset: 39) in her sister. She  reports that she has never smoked. She has never used smokeless tobacco. She reports that she does not drink alcohol or use drugs. She  has a current medication list which includes the following prescription(s): acetaminophen, calcium citrate-vitamin d, cyclobenzaprine, ferrous sulfate, hydrochlorothiazide, levothyroxine, metformin, omeprazole, simvastatin, and vitamin b-12. She has No Known Allergies.       Review of Systems:  Review of Systems  Constitutional: Denied constitutional symptoms, night sweats, recent illness, fatigue, fever, insomnia and weight loss.  Eyes: Denied eye symptoms, eye pain, photophobia, vision change and visual disturbance.  Ears/Nose/Throat/Neck: Denied ear, nose, throat or neck symptoms, hearing loss, nasal discharge, sinus congestion and sore throat.  Cardiovascular: Denied cardiovascular symptoms, arrhythmia, chest pain/pressure, edema, exercise intolerance, orthopnea and palpitations.  Respiratory: Denied pulmonary symptoms, asthma, pleuritic pain, productive sputum, cough, dyspnea and wheezing.  Gastrointestinal: Denied, gastro-esophageal reflux, melena, nausea and vomiting.  Genitourinary: Denied genitourinary symptoms including symptomatic vaginal discharge, pelvic relaxation issues, and urinary complaints.  Musculoskeletal: Denied musculoskeletal symptoms, stiffness, swelling, muscle weakness and myalgia.  Dermatologic: Denied dermatology symptoms, rash and scar.  Neurologic: Denied neurology symptoms, dizziness, headache, neck pain and syncope.  Psychiatric: Denied psychiatric symptoms, anxiety and depression.  Endocrine: Denied endocrine symptoms including hot flashes and night sweats.   Meds:   Current Outpatient Medications on File Prior to Visit  Medication Sig Dispense Refill  . acetaminophen (TYLENOL) 325 MG tablet Take 650 mg by mouth every 6 (six) hours as needed.    . Calcium Carbonate-Vitamin D (CALCIUM + D PO) Take 1 tablet by mouth daily. CALCITRATE/VITAMIN D 315-200 MG-UNIT TABSGeneric: CALCIUM CITRATE-VITAMIN Dtwo by mouth every morning and one by mouth every evening     .  cyclobenzaprine (FLEXERIL) 5 MG tablet Take 5 mg by mouth 3 (three) times daily as needed.     Marland Kitchen  ferrous sulfate 325 (65 FE) MG EC tablet Take 325 mg by mouth 3 (three) times daily with meals.    . hydrochlorothiazide (HYDRODIURIL) 25 MG tablet 25 mg.    . levothyroxine (SYNTHROID, LEVOTHROID) 125 MCG tablet Take 125 mcg by mouth daily before breakfast.    . metFORMIN (GLUCOPHAGE) 500 MG tablet Take 500 mg by mouth daily.     Marland Kitchen omeprazole (PRILOSEC) 20 MG capsule Take 20 mg by mouth daily.     . simvastatin (ZOCOR) 20 MG tablet Take 20 mg by mouth daily.     . vitamin B-12 (CYANOCOBALAMIN) 1000 MCG tablet Take 1 tablet by mouth daily.     No current facility-administered medications on file prior to visit.    Objective:     Vitals:   03/15/19 0810  BP: 116/79  Pulse: 81              Physical examination General NAD, Conversant  HEENT Atraumatic; Op clear with mmm.  Normo-cephalic. Pupils reactive. Anicteric sclerae  Thyroid/Neck Smooth without nodularity or enlargement. Normal ROM.  Neck Supple.  Skin No rashes, lesions or ulceration. Normal palpated skin turgor. No nodularity.  Breasts: No masses or discharge.  Symmetric.  No axillary adenopathy.  Lungs: Clear to auscultation.No rales or wheezes. Normal Respiratory effort, no retractions.  Heart: NSR.  No murmurs or rubs appreciated. No periferal edema  Abdomen: Soft.  Non-tender.  No masses.  No HSM. No hernia  Extremities: Moves all appropriately.  Normal ROM for age. No lymphadenopathy.  Neuro: Oriented to PPT.  Normal mood. Normal affect.     Pelvic:   Vulva: Normal appearance.  No lesions.  Vagina: No lesions or abnormalities noted.  Support:  Second-degree cystocele second-degree rectocele  Urethra No masses tenderness or scarring.  Meatus Normal size without lesions or prolapse.  Cervix: Normal appearance.  No lesions.  Anus: Normal exam.  No lesions.  Perineum: Normal exam.  No lesions.        Bimanual   Uterus:  Normal size.  Non-tender.  Mobile.  AV.  Adnexae: No masses.  Non-tender to palpation.  Cul-de-sac: Negative for abnormality.      Assessment:    G1P1001 Patient Active Problem List   Diagnosis Date Noted  . Carcinoma of overlapping sites of right breast in female, estrogen receptor positive ([place]) 08/24/2016  . Breast cyst 09/03/2015  . Other long term (current) drug therapy 11/20/2014  . Cervical radiculitis 08/15/2014  . Muscle spasms of neck 08/15/2014  . 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 DDD (degenerative disc disease), lumbar 11/18/2013  . Benign essential HTN 10/03/2013  . Acid reflux 10/03/2013  . Big thyroid 10/03/2013  . Hypercholesterolemia 10/03/2013  . Lung nodule, solitary 01/11/2012  . Malignant neoplasm of skeletal system ([place]) 07/06/2011  . H/O malignant neoplasm of breast 08/29/2000     1. Well woman exam with routine gynecological exam        Plan:            1.  Basic Screening Recommendations The basic screening recommendations for asymptomatic women were discussed with the patient during her visit.  The age-appropriate recommendations were discussed with her and the rational for the tests reviewed.  When I am informed by the patient that another primary care physician has previously obtained the age-appropriate tests and they are up-to-date, only outstanding tests are ordered and referrals given as necessary.  Abnormal results of tests will be discussed with her when all of her results are  completed.  Routine preventative health maintenance measures emphasized: Exercise/Diet/Weight control, Tobacco Warnings, Alcohol/Substance use risks and Stress Management Pap cotest performed Orders No orders of the defined types were placed in this encounter.   No orders of the defined types were placed in this encounter.       F/U  Return in about 1 year (around 03/14/2020) for Annual Physical.  Finis Bud, M.D. 03/15/2019 8:39 AM

## 2019-03-15 NOTE — Addendum Note (Signed)
Addended by: Garner Nash on: 03/15/2019 09:15 AM   Modules accepted: Orders

## 2019-03-15 NOTE — Progress Notes (Signed)
Patient here for annual exam, no complaints.  

## 2019-09-05 ENCOUNTER — Ambulatory Visit: Payer: Medicare Other | Admitting: Internal Medicine

## 2019-09-05 ENCOUNTER — Other Ambulatory Visit: Payer: Medicare Other

## 2019-09-12 DIAGNOSIS — Z8669 Personal history of other diseases of the nervous system and sense organs: Secondary | ICD-10-CM | POA: Insufficient documentation

## 2019-09-13 ENCOUNTER — Inpatient Hospital Stay (HOSPITAL_BASED_OUTPATIENT_CLINIC_OR_DEPARTMENT_OTHER): Payer: Medicare Other | Admitting: Internal Medicine

## 2019-09-13 ENCOUNTER — Other Ambulatory Visit: Payer: Self-pay

## 2019-09-13 ENCOUNTER — Encounter: Payer: Self-pay | Admitting: Internal Medicine

## 2019-09-13 ENCOUNTER — Inpatient Hospital Stay: Payer: Medicare Other | Attending: Internal Medicine

## 2019-09-13 VITALS — BP 127/73 | HR 66 | Temp 96.8°F | Resp 18 | Wt 208.8 lb

## 2019-09-13 DIAGNOSIS — Z79899 Other long term (current) drug therapy: Secondary | ICD-10-CM | POA: Insufficient documentation

## 2019-09-13 DIAGNOSIS — E039 Hypothyroidism, unspecified: Secondary | ICD-10-CM | POA: Insufficient documentation

## 2019-09-13 DIAGNOSIS — Z7984 Long term (current) use of oral hypoglycemic drugs: Secondary | ICD-10-CM | POA: Diagnosis not present

## 2019-09-13 DIAGNOSIS — K219 Gastro-esophageal reflux disease without esophagitis: Secondary | ICD-10-CM | POA: Insufficient documentation

## 2019-09-13 DIAGNOSIS — C50811 Malignant neoplasm of overlapping sites of right female breast: Secondary | ICD-10-CM

## 2019-09-13 DIAGNOSIS — Z170 Estrogen receptor positive status [ER+]: Secondary | ICD-10-CM

## 2019-09-13 DIAGNOSIS — Z9221 Personal history of antineoplastic chemotherapy: Secondary | ICD-10-CM | POA: Diagnosis not present

## 2019-09-13 DIAGNOSIS — I10 Essential (primary) hypertension: Secondary | ICD-10-CM | POA: Insufficient documentation

## 2019-09-13 DIAGNOSIS — Z803 Family history of malignant neoplasm of breast: Secondary | ICD-10-CM | POA: Diagnosis not present

## 2019-09-13 DIAGNOSIS — M1990 Unspecified osteoarthritis, unspecified site: Secondary | ICD-10-CM | POA: Insufficient documentation

## 2019-09-13 DIAGNOSIS — Z9223 Personal history of estrogen therapy: Secondary | ICD-10-CM | POA: Diagnosis not present

## 2019-09-13 DIAGNOSIS — Z9011 Acquired absence of right breast and nipple: Secondary | ICD-10-CM | POA: Insufficient documentation

## 2019-09-13 DIAGNOSIS — E119 Type 2 diabetes mellitus without complications: Secondary | ICD-10-CM | POA: Diagnosis not present

## 2019-09-13 DIAGNOSIS — C419 Malignant neoplasm of bone and articular cartilage, unspecified: Secondary | ICD-10-CM | POA: Diagnosis not present

## 2019-09-13 DIAGNOSIS — Z853 Personal history of malignant neoplasm of breast: Secondary | ICD-10-CM | POA: Diagnosis present

## 2019-09-13 LAB — CBC WITH DIFFERENTIAL/PLATELET
Abs Immature Granulocytes: 0.02 10*3/uL (ref 0.00–0.07)
Basophils Absolute: 0 10*3/uL (ref 0.0–0.1)
Basophils Relative: 0 %
Eosinophils Absolute: 0.1 10*3/uL (ref 0.0–0.5)
Eosinophils Relative: 1 %
HCT: 38.9 % (ref 36.0–46.0)
Hemoglobin: 13.2 g/dL (ref 12.0–15.0)
Immature Granulocytes: 0 %
Lymphocytes Relative: 29 %
Lymphs Abs: 1.3 10*3/uL (ref 0.7–4.0)
MCH: 28.9 pg (ref 26.0–34.0)
MCHC: 33.9 g/dL (ref 30.0–36.0)
MCV: 85.1 fL (ref 80.0–100.0)
Monocytes Absolute: 0.3 10*3/uL (ref 0.1–1.0)
Monocytes Relative: 6 %
Neutro Abs: 2.9 10*3/uL (ref 1.7–7.7)
Neutrophils Relative %: 64 %
Platelets: 210 10*3/uL (ref 150–400)
RBC: 4.57 MIL/uL (ref 3.87–5.11)
RDW: 12.5 % (ref 11.5–15.5)
WBC: 4.5 10*3/uL (ref 4.0–10.5)
nRBC: 0 % (ref 0.0–0.2)

## 2019-09-13 LAB — COMPREHENSIVE METABOLIC PANEL
ALT: 14 U/L (ref 0–44)
AST: 14 U/L — ABNORMAL LOW (ref 15–41)
Albumin: 4 g/dL (ref 3.5–5.0)
Alkaline Phosphatase: 72 U/L (ref 38–126)
Anion gap: 6 (ref 5–15)
BUN: 12 mg/dL (ref 8–23)
CO2: 32 mmol/L (ref 22–32)
Calcium: 9.4 mg/dL (ref 8.9–10.3)
Chloride: 101 mmol/L (ref 98–111)
Creatinine, Ser: 0.71 mg/dL (ref 0.44–1.00)
GFR calc Af Amer: >60 mL/min (ref >60)
GFR calc non Af Amer: >60 mL/min (ref >60)
Glucose, Bld: 112 mg/dL — ABNORMAL HIGH (ref 70–99)
Potassium: 4 mmol/L (ref 3.5–5.1)
Sodium: 139 mmol/L (ref 135–145)
Total Bilirubin: 0.5 mg/dL (ref 0.3–1.2)
Total Protein: 6.9 g/dL (ref 6.5–8.1)

## 2019-09-13 LAB — LACTATE DEHYDROGENASE: LDH: 113 U/L (ref 98–192)

## 2019-09-13 NOTE — Progress Notes (Signed)
[place] OFFICE PROGRESS NOTE  Patient Care Team: Ezequiel Kayser, MD as PCP - General (Internal Medicine) Bary Castilla, Forest Gleason, MD (General Surgery) Cammie Sickle, MD as Consulting Physician (Internal Medicine)  Cancer Staging No matching staging information was found for the patient.   Oncology History Overview Note  Chief Complaint/Problem List  Breast cancer: T2N0M0, stage II. HER-2/neu 3 +. Estrogen receptor greater than 90%. Progesterone receptor greater than 90%.    Previous therapy:   1. Right mastectomy and axillary lymph node dissection- s/p. Adriamycin and Cytoxan.  3. Tamoxifen 20 mg daily  4. Vaginal bleeding on Tamoxifen in November 2004.   5. Aromasin 25 mg daily starting January 2005. 6. Finished aromosin   was seen in November of 2007, and has refused NSABP B. 42 study  7. Left thigh sarcoma, 2009 [[facility]; s/p Sarcoma spl; Dr.Savage; [facility]; followed by local recurrence s/p RT; Dr.Crystal; no chemo]; last MRI dec 2017- [facility]   # 2015- Bil Lung nodules [1.3-1.7cm; CXR- April 2018; Dr.Savage]  -----------------------------------------------------  S/p resection ([facility]) July 2009. 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 Focal margin positivity.Three benign appearing lymph nodes.  Surveillance plan: MRI q 6 month through 2014, then annually.  DIAGNOSIS: Extraskeletal myxoid chondrosarcoma /lung nodules   STAGE:   4      ;GOALS: Control  CURRENT/MOST RECENT THERAPY: Surveillance    Malignant neoplasm of skeletal system (HCC)  Carcinoma of overlapping sites of right breast in female, estrogen receptor positive ([place])      INTERVAL HISTORY:  Danielle Hansen 69 y.o.  female pleasant patient above history of sarcoma/lung nodules; also history of breast cancer is here for follow-up.  Patient continues to follow-up closely at [facility] for history of sarcoma.     Patient denies any worsening of her diabetes. Otherwise no nausea no vomiting no headaches.   Review of Systems  Constitutional: Negative for chills, diaphoresis, fever, malaise/fatigue and weight loss.  HENT: Negative for nosebleeds and sore throat.   Eyes: Negative for double vision.  Respiratory: Negative for cough, hemoptysis, sputum production, shortness of breath and wheezing.   Cardiovascular: Negative for chest pain, palpitations, orthopnea and leg swelling.  Gastrointestinal: Negative for abdominal pain, blood in stool, constipation, diarrhea, heartburn, melena, nausea and vomiting.  Genitourinary: Negative for dysuria, frequency and urgency.  Musculoskeletal: Negative for back pain and joint pain.  Skin: Negative.  Negative for itching and rash.  Neurological: Negative for dizziness, tingling, focal weakness, weakness and headaches.  Endo/Heme/Allergies: Does not bruise/bleed easily.  Psychiatric/Behavioral: Negative for depression. The patient is not nervous/anxious and does not have insomnia.       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 Dwyane Luo office per notes    BUNIONECTOMY Right 2017   COLONOSCOPY  2014   Verdie Shire, MD   COLONOSCOPY WITH PROPOFOL N/A 02/01/2018    Procedure: COLONOSCOPY WITH PROPOFOL;  Surgeon: Toledo, Benay Pike, MD;  Location: [facility] ENDOSCOPY;  Service: Gastroenterology;  Laterality: N/A;   FRACTURE SURGERY Right 12/2016   [facility]   HALLUX VALGUS LAPIDUS Right 05/08/2015   Procedure: LAPIPLASTY 1ST METATARSAL GREAT TOE;  Surgeon: Albertine Patricia, DPM;  Location: [facility];  Service: Podiatry;  Laterality: Right;   KNEE ARTHROSCOPY Right 05/03   MASTECTOMY Right 09/09/00   09/09/00-Dr Pat Patrick mastectomy/chemo   OOPHORECTOMY Right 1990   right tube and ovary removed   SALPINGECTOMY Right 1990   SOFT TISSUE TUMOR RESECTION Left 7/09, 6/11   gluteal    FAMILY HISTORY :   Family History  Problem Relation Age of Onset   Breast cancer Sister 57       2004, 2011   Heart disease Sister    Diabetes Father    Hypertension Father    Heart disease Brother    Uterine cancer Sister 49   Diabetes Sister    Ovarian cancer Neg Hx    Colon cancer Neg Hx     SOCIAL HISTORY:   Social History   Tobacco Use   Smoking status: Never Smoker   Smokeless tobacco: Never Used  Scientific laboratory technician Use: Never used  Substance Use Topics   Alcohol use: No    Alcohol/week: 0.0 standard drinks   Drug use: No    ALLERGIES:  has No Known Allergies.  MEDICATIONS:  Current Outpatient Medications  Medication Sig Dispense Refill   acetaminophen (TYLENOL) 325 MG tablet Take 650 mg by mouth every 6 (six) hours as needed.     Calcium Carbonate-Vitamin D (CALCIUM + D PO) Take 1 tablet by mouth daily. CALCITRATE/VITAMIN D 315-200 MG-UNIT TABSGeneric: CALCIUM CITRATE-VITAMIN Dtwo by mouth every morning and one by mouth every evening      cyclobenzaprine (FLEXERIL) 5 MG tablet Take 5 mg by mouth 3 (three) times daily as needed.      ferrous sulfate 325 (65 FE) MG EC tablet Take 325 mg by mouth 3 (three) times daily with meals.     hydrochlorothiazide (HYDRODIURIL) 25 MG tablet 25 mg.     levothyroxine (SYNTHROID, LEVOTHROID) 125  MCG tablet Take 125 mcg by mouth daily before breakfast.     metFORMIN (GLUCOPHAGE) 500 MG tablet Take 500 mg by mouth daily.      omeprazole (PRILOSEC) 20 MG capsule Take 20 mg by mouth daily.      simvastatin (ZOCOR) 20 MG tablet Take 20 mg by mouth daily.      vitamin B-12 (CYANOCOBALAMIN) 1000 MCG tablet Take 1 tablet by mouth daily.     No current facility-administered medications for this visit.    PHYSICAL EXAMINATION: ECOG PERFORMANCE STATUS: 0 - Asymptomatic  BP 127/73 (BP Location: Left Arm, Patient Position: Sitting, Cuff Size: Normal)    Pulse 66    Temp (!) 96.8 F (36 C) (Tympanic)    Resp 18    Wt 208 lb 12.8 oz (94.7 kg)    SpO2 100%    BMI 30.83 kg/m   Filed Weights   09/13/19 0931  Weight: 208 lb 12.8 oz (94.7 kg)   Physical Exam HENT:     Head: Normocephalic and atraumatic.     Mouth/Throat:     Pharynx: No oropharyngeal  exudate.  Eyes:     Pupils: Pupils are equal, round, and reactive to light.  Cardiovascular:     Rate and Rhythm: Normal rate and regular rhythm.  Pulmonary:     Effort: Pulmonary effort is normal. No respiratory distress.     Breath sounds: Normal breath sounds. No wheezing.  Abdominal:     General: Bowel sounds are normal. There is no distension.     Palpations: Abdomen is soft. There is no mass.     Tenderness: There is no abdominal tenderness. There is no guarding or rebound.  Musculoskeletal:        General: No tenderness. Normal range of motion.     Cervical back: Normal range of motion and neck supple.  Skin:    General: Skin is warm.     Comments: Right and left BREAST exam (in the presence of nurse)- no unusual skin changes or dominant masses felt. Surgical scars noted.    Neurological:     Mental Status: She is alert and oriented to person, place, and time.  Psychiatric:        Mood and Affect: Affect normal.      LABORATORY DATA:  I have reviewed the data as listed    Component Value Date/Time   NA 139  09/13/2019 0907   NA 137 08/03/2013 0838   K 4.0 09/13/2019 0907   K 3.4 (L) 08/03/2013 0838   CL 101 09/13/2019 0907   CL 98 08/03/2013 0838   CO2 32 09/13/2019 0907   CO2 32 08/03/2013 0838   GLUCOSE 112 (H) 09/13/2019 0907   GLUCOSE 121 (H) 08/03/2013 0838   BUN 12 09/13/2019 0907   BUN 12 08/03/2013 0838   CREATININE 0.71 09/13/2019 0907   CREATININE 0.90 08/03/2013 0838   CALCIUM 9.4 09/13/2019 0907   CALCIUM 9.5 08/03/2013 0838   PROT 6.9 09/13/2019 0907   PROT 7.9 08/03/2013 0838   ALBUMIN 4.0 09/13/2019 0907   ALBUMIN 3.8 08/03/2013 0838   AST 14 (L) 09/13/2019 0907   AST 18 08/03/2013 0838   ALT 14 09/13/2019 0907   ALT 23 08/03/2013 0838   ALKPHOS 72 09/13/2019 0907   ALKPHOS 115 08/03/2013 0838   BILITOT 0.5 09/13/2019 0907   BILITOT 0.4 08/03/2013 0838   GFRNONAA >60 09/13/2019 0907   GFRNONAA >60 08/03/2013 0838   GFRAA >60 09/13/2019 0907   GFRAA >60 08/03/2013 0838    No results found for: SPEP, UPEP  Lab Results  Component Value Date   WBC 4.5 09/13/2019   NEUTROABS 2.9 09/13/2019   HGB 13.2 09/13/2019   HCT 38.9 09/13/2019   MCV 85.1 09/13/2019   PLT 210 09/13/2019      Chemistry      Component Value Date/Time   NA 139 09/13/2019 0907   NA 137 08/03/2013 0838   K 4.0 09/13/2019 0907   K 3.4 (L) 08/03/2013 0838   CL 101 09/13/2019 0907   CL 98 08/03/2013 0838   CO2 32 09/13/2019 0907   CO2 32 08/03/2013 0838   BUN 12 09/13/2019 0907   BUN 12 08/03/2013 0838   CREATININE 0.71 09/13/2019 0907   CREATININE 0.90 08/03/2013 0838      Component Value Date/Time   CALCIUM 9.4 09/13/2019 0907   CALCIUM 9.5 08/03/2013 0838   ALKPHOS 72 09/13/2019 0907   ALKPHOS 115 08/03/2013 0838   AST 14 (L) 09/13/2019 0907   AST 18 08/03/2013 0838   ALT 14 09/13/2019 0907  ALT 23 08/03/2013 0838   BILITOT 0.5 09/13/2019 0907   BILITOT 0.4 08/03/2013 8592       RADIOGRAPHIC STUDIES: I have personally reviewed the radiological images as listed and  agreed with the findings in the report. No results found.   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  STABLE. Ordered mammogram from August 2021  #Diabetes well controlled-on oral hypoglycemic agents.  STABLE  # DISPOSITION:  # Mammogram left screening in second week of august/ [facility].  # Follow-up-12 months-  MD/labs-cbc/cmp/ldh- Dr.B   Orders Placed This Encounter  Procedures   MM 3D SCREEN BREAST UNI LEFT    Standing Status:   Future    Standing Expiration Date:   09/12/2020    Order Specific Question:   Reason for Exam (SYMPTOM  OR DIAGNOSIS REQUIRED)    Answer:   history of breast cancer    Order Specific Question:   Preferred imaging location?    Answer:   [facility]   CBC with Differential    Standing Status:   Future    Standing Expiration Date:   09/12/2020   Comprehensive metabolic panel    Standing Status:   Future    Standing Expiration Date:   09/12/2020   Lactate dehydrogenase    Standing Status:   Future    Standing Expiration Date:   09/12/2020   All questions were answered. The patient knows to call the clinic with any problems, questions or concerns.      Cammie Sickle, MD 09/20/2019 12:47 PM

## 2019-09-13 NOTE — Assessment & Plan Note (Addendum)
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  STABLE. Ordered mammogram from August 2021  #Diabetes well controlled-on oral hypoglycemic agents.  STABLE  # DISPOSITION:  # Mammogram left screening in second week of august/ [facility].  # Follow-up-12 months-  MD/labs-cbc/cmp/ldh- Dr.B

## 2019-10-09 ENCOUNTER — Ambulatory Visit
Admission: RE | Admit: 2019-10-09 | Discharge: 2019-10-09 | Disposition: A | Discharge status: Home / Self Care | Payer: Medicare Other | Source: Ambulatory Visit | Attending: Internal Medicine | Admitting: Internal Medicine

## 2019-10-09 ENCOUNTER — Other Ambulatory Visit: Payer: Self-pay

## 2019-10-09 DIAGNOSIS — Z1231 Encounter for screening mammogram for malignant neoplasm of breast: Secondary | ICD-10-CM | POA: Insufficient documentation

## 2019-10-09 DIAGNOSIS — Z9011 Acquired absence of right breast and nipple: Secondary | ICD-10-CM | POA: Diagnosis not present

## 2019-10-09 DIAGNOSIS — C50811 Malignant neoplasm of overlapping sites of right female breast: Secondary | ICD-10-CM

## 2020-03-25 ENCOUNTER — Ambulatory Visit (INDEPENDENT_AMBULATORY_CARE_PROVIDER_SITE_OTHER): Payer: Medicare Other | Admitting: Obstetrics and Gynecology

## 2020-03-25 ENCOUNTER — Other Ambulatory Visit (HOSPITAL_COMMUNITY)
Admission: RE | Admit: 2020-03-25 | Discharge: 2020-03-25 | Disposition: A | Discharge status: Home / Self Care | Payer: Medicare Other | Source: Ambulatory Visit | Attending: Obstetrics and Gynecology | Admitting: Obstetrics and Gynecology

## 2020-03-25 ENCOUNTER — Other Ambulatory Visit: Payer: Self-pay

## 2020-03-25 ENCOUNTER — Encounter: Payer: Self-pay | Admitting: Obstetrics and Gynecology

## 2020-03-25 VITALS — BP 117/71 | HR 69 | Ht 69.0 in | Wt 221.1 lb

## 2020-03-25 DIAGNOSIS — Z124 Encounter for screening for malignant neoplasm of cervix: Secondary | ICD-10-CM

## 2020-03-25 DIAGNOSIS — N8189 Other female genital prolapse: Secondary | ICD-10-CM | POA: Diagnosis not present

## 2020-03-25 DIAGNOSIS — Z01419 Encounter for gynecological examination (general) (routine) without abnormal findings: Secondary | ICD-10-CM

## 2020-03-25 DIAGNOSIS — Z1151 Encounter for screening for human papillomavirus (HPV): Secondary | ICD-10-CM | POA: Diagnosis not present

## 2020-03-25 LAB — CYTOLOGY - PAP: Diagnosis: NEGATIVE

## 2020-03-25 NOTE — Progress Notes (Addendum)
HPI:      Danielle Hansen is a 70 y.o. G1P1001 who LMP was No LMP recorded. Patient is postmenopausal.  Subjective:   She presents today for her annual examination.  She presents today for her annual examination.  She has no complaints.  She feels well. Significant history includes history of breast cancer right side more than 18 years ago.  Subsequent mastectomy. History also includes left hip/back sarcomatous lesion with 2 resections and subsequent radiation.  Resolved at this time as well. Patient has received Covid vaccination and booster.    Hx: The following portions of the patient's history were reviewed and updated as appropriate:             She  has a past medical history of Arthritis, Breast cancer ([place]) (2002), Cancer ([place]) (09/09/00), Cancer ([place]) (2009/2011), Degenerative disc disease, cervical, Diabetes mellitus without complication ([place]), GERD (gastroesophageal reflux disease), Hypertension, Hypothyroid, Lung nodule, Personal history of chemotherapy, Soft tissue sarcoma of pelvis [facility]) (2009 & 2011), and Uterine fibroid. She does not have any pertinent problems on file. She  has a past surgical history that includes Salpingectomy (Right, 1990); Knee arthroscopy (Right, 05/03); Soft Tissue Tumor Resection (Left, 7/09, 6/11); Hallux valgus lapidus (Right, 05/08/2015); Bunionectomy (Right, 2017); Colonoscopy (2014); Oophorectomy (Right, 1990); Mastectomy (Right, 09/09/00); Breast cyst aspiration (Left, 09/30/2016); Fracture surgery (Right, 12/2016); and Colonoscopy with propofol (N/A, 02/01/2018). Her family history includes Breast cancer (age of onset: 49) in her sister; Diabetes in her father and sister; Heart disease in her brother and sister; Hypertension in her father; Uterine cancer (age of onset: 56) in her sister. She  reports that she has never smoked. She has never used smokeless tobacco. She reports that she does not drink alcohol and does not use drugs. She has a current  medication list which includes the following prescription(s): acetaminophen, calcium citrate-vitamin d, cyclobenzaprine, hydrochlorothiazide, levothyroxine, metformin, omeprazole, simvastatin, vitamin b-12, and ferrous sulfate. She has No Known Allergies.       Review of Systems:  Review of Systems  Constitutional: Denied constitutional symptoms, night sweats, recent illness, fatigue, fever, insomnia and weight loss.  Eyes: Denied eye symptoms, eye pain, photophobia, vision change and visual disturbance.  Ears/Nose/Throat/Neck: Denied ear, nose, throat or neck symptoms, hearing loss, nasal discharge, sinus congestion and sore throat.  Cardiovascular: Denied cardiovascular symptoms, arrhythmia, chest pain/pressure, edema, exercise intolerance, orthopnea and palpitations.  Respiratory: Denied pulmonary symptoms, asthma, pleuritic pain, productive sputum, cough, dyspnea and wheezing.  Gastrointestinal: Denied, gastro-esophageal reflux, melena, nausea and vomiting.  Genitourinary: Denied genitourinary symptoms including symptomatic vaginal discharge, pelvic relaxation issues, and urinary complaints.  Musculoskeletal: Denied musculoskeletal symptoms, stiffness, swelling, muscle weakness and myalgia.  Dermatologic: Denied dermatology symptoms, rash and scar.  Neurologic: Denied neurology symptoms, dizziness, headache, neck pain and syncope.  Psychiatric: Denied psychiatric symptoms, anxiety and depression.  Endocrine: Denied endocrine symptoms including hot flashes and night sweats.   Meds:   Current Outpatient Medications on File Prior to Visit  Medication Sig Dispense Refill  . acetaminophen (TYLENOL) 325 MG tablet Take 650 mg by mouth every 6 (six) hours as needed.    . Calcium Carbonate-Vitamin D (CALCIUM + D PO) Take 1 tablet by mouth daily. CALCITRATE/VITAMIN D 315-200 MG-UNIT TABSGeneric: CALCIUM CITRATE-VITAMIN Dtwo by mouth every morning and one by mouth every evening     .  cyclobenzaprine (FLEXERIL) 5 MG tablet Take 5 mg by mouth 3 (three) times daily as needed.     . hydrochlorothiazide (HYDRODIURIL) 25 MG  tablet 25 mg.    . levothyroxine (SYNTHROID, LEVOTHROID) 125 MCG tablet Take 125 mcg by mouth daily before breakfast.    . metFORMIN (GLUCOPHAGE) 500 MG tablet Take 500 mg by mouth daily.     Marland Kitchen omeprazole (PRILOSEC) 20 MG capsule Take 20 mg by mouth daily.     . simvastatin (ZOCOR) 20 MG tablet Take 20 mg by mouth daily.     . vitamin B-12 (CYANOCOBALAMIN) 1000 MCG tablet Take 1 tablet by mouth daily.    . ferrous sulfate 325 (65 FE) MG EC tablet Take 325 mg by mouth 3 (three) times daily with meals. (Patient not taking: Reported on 03/25/2020)     No current facility-administered medications on file prior to visit.          Objective:     Vitals:   03/25/20 0809  BP: 117/71  Pulse: 69    Filed Weights   03/25/20 0809  Weight: 221 lb 1.6 oz (100.3 kg)              Physical examination General NAD, Conversant  HEENT Atraumatic; Op clear with mmm.  Normo-cephalic. Pupils reactive. Anicteric sclerae  Thyroid/Neck Smooth without nodularity or enlargement. Normal ROM.  Neck Supple.  Skin No rashes, lesions or ulceration. Normal palpated skin turgor. No nodularity.  Breasts: No masses or discharge.  Symmetric.  No axillary adenopathy.  Lungs: Clear to auscultation.No rales or wheezes. Normal Respiratory effort, no retractions.  Heart: NSR.  No murmurs or rubs appreciated. No periferal edema  Abdomen: Soft.  Non-tender.  No masses.  No HSM. No hernia  Extremities: Moves all appropriately.  Normal ROM for age. No lymphadenopathy.  Neuro: Oriented to PPT.  Normal mood. Normal affect.     Pelvic:   Vulva: Normal appearance.  No lesions.  Vagina: No lesions or abnormalities noted.  Vaginal atrophy  Support:  Second-degree cystocele  Urethra No masses tenderness or scarring.  Meatus Normal size without lesions or prolapse.  Cervix: Normal  appearance.  No lesions.  Anus: Normal exam.  No lesions.  Perineum: Normal exam.  No lesions.        Bimanual   Uterus: Normal size.  Non-tender.  Mobile.  AV.  Adnexae: No masses.  Non-tender to palpation.  Cul-de-sac: Negative for abnormality.      Assessment:    G1P1001 Patient Active Problem List   Diagnosis Date Noted  . History of obstructive sleep apnea 09/12/2019  . Closed fracture of lower end of right radius with routine healing 01/09/2018  . History of fracture of radius 01/11/2017  . Carcinoma of overlapping sites of right breast in female, estrogen receptor positive ([place]) 08/24/2016  . B12 deficiency 11/22/2015  . Breast cyst 09/03/2015  . Other long term (current) drug therapy 11/20/2014  . Cervical radiculitis 08/15/2014  . Muscle spasms of neck 08/15/2014  . 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 Type 2 diabetes mellitus with other specified complication ([place]) 96/29/5284  . DDD (degenerative disc disease), lumbar 11/18/2013  . Benign essential HTN 10/03/2013  . Acid reflux 10/03/2013  . Big thyroid 10/03/2013  . Hypercholesterolemia 10/03/2013  . Hyperlipidemia associated with type 2 diabetes mellitus ([place]) 10/03/2013  . Hypertension associated with type 2 diabetes mellitus ([place]) 10/03/2013  . Lung nodule, solitary 01/11/2012  . Malignant neoplasm of skeletal system ([place]) 07/06/2011  . Myxoid chondrosarcoma ([place]) 07/06/2011  . H/O malignant neoplasm of breast 08/29/2000     1. Pelvic relaxation disorder   2. Well woman exam  with routine gynecological exam   3. Screening for cervical cancer     Normal exam as expected.  Pelvic relaxation unchanged from last year.   Plan:            1.  Basic Screening Recommendations The basic screening recommendations for asymptomatic women were discussed with the patient during her visit.  The age-appropriate recommendations were discussed with her and the rational for the tests reviewed.  When I am informed by the  patient that another primary care physician has previously obtained the age-appropriate tests and they are up-to-date, only outstanding tests are ordered and referrals given as necessary.  Abnormal results of tests will be discussed with her when all of her results are completed.  Routine preventative health maintenance measures emphasized: Exercise/Diet/Weight control, Tobacco Warnings, Alcohol/Substance use risks and Stress Management Patient up-to-date on mammogram Pap cotest performed-if this is normal patient will age out. Orders No orders of the defined types were placed in this encounter.   No orders of the defined types were placed in this encounter.         F/U  Return in about 1 year (around 03/25/2021) for Annual Physical. I spent 31 minutes involved in the care of this patient preparing to see the patient by obtaining and reviewing her medical history (including labs, imaging tests and prior procedures), documenting clinical information in the electronic health record (EHR), counseling and coordinating care plans, writing and sending prescriptions, ordering tests or procedures and directly communicating with the patient by discussing pertinent items from her history and physical exam as well as detailing my assessment and plan as noted above so that she has an informed understanding.  All of her questions were answered.  Finis Bud, M.D. 04/28/2020 10:42 AM

## 2020-03-25 NOTE — Addendum Note (Signed)
Addended by: Durwin Glaze on: 03/25/2020 09:07 AM   Modules accepted: Orders

## 2020-03-27 LAB — CYTOLOGY - PAP
Comment: NEGATIVE
Diagnosis: NEGATIVE
High risk HPV: NEGATIVE

## 2020-04-28 NOTE — Addendum Note (Signed)
Addended by: Finis Bud on: 04/28/2020 10:42 AM   Modules accepted: Level of Service

## 2020-09-12 ENCOUNTER — Inpatient Hospital Stay: Payer: Medicare Other | Attending: Internal Medicine

## 2020-09-12 ENCOUNTER — Inpatient Hospital Stay (HOSPITAL_BASED_OUTPATIENT_CLINIC_OR_DEPARTMENT_OTHER): Payer: Medicare Other | Admitting: Internal Medicine

## 2020-09-12 ENCOUNTER — Encounter: Payer: Self-pay | Admitting: Internal Medicine

## 2020-09-12 VITALS — BP 128/82 | HR 73 | Temp 96.7°F | Resp 17 | Wt 236.0 lb

## 2020-09-12 DIAGNOSIS — R635 Abnormal weight gain: Secondary | ICD-10-CM | POA: Insufficient documentation

## 2020-09-12 DIAGNOSIS — Z803 Family history of malignant neoplasm of breast: Secondary | ICD-10-CM | POA: Diagnosis not present

## 2020-09-12 DIAGNOSIS — M1990 Unspecified osteoarthritis, unspecified site: Secondary | ICD-10-CM | POA: Diagnosis not present

## 2020-09-12 DIAGNOSIS — Z853 Personal history of malignant neoplasm of breast: Secondary | ICD-10-CM | POA: Insufficient documentation

## 2020-09-12 DIAGNOSIS — Z8249 Family history of ischemic heart disease and other diseases of the circulatory system: Secondary | ICD-10-CM | POA: Diagnosis not present

## 2020-09-12 DIAGNOSIS — I10 Essential (primary) hypertension: Secondary | ICD-10-CM | POA: Diagnosis not present

## 2020-09-12 DIAGNOSIS — Z8049 Family history of malignant neoplasm of other genital organs: Secondary | ICD-10-CM | POA: Insufficient documentation

## 2020-09-12 DIAGNOSIS — Z1231 Encounter for screening mammogram for malignant neoplasm of breast: Secondary | ICD-10-CM | POA: Diagnosis not present

## 2020-09-12 DIAGNOSIS — C50811 Malignant neoplasm of overlapping sites of right female breast: Secondary | ICD-10-CM

## 2020-09-12 DIAGNOSIS — Z79899 Other long term (current) drug therapy: Secondary | ICD-10-CM | POA: Insufficient documentation

## 2020-09-12 DIAGNOSIS — Z9079 Acquired absence of other genital organ(s): Secondary | ICD-10-CM | POA: Diagnosis not present

## 2020-09-12 DIAGNOSIS — Z9011 Acquired absence of right breast and nipple: Secondary | ICD-10-CM | POA: Insufficient documentation

## 2020-09-12 DIAGNOSIS — M2550 Pain in unspecified joint: Secondary | ICD-10-CM | POA: Insufficient documentation

## 2020-09-12 DIAGNOSIS — Z833 Family history of diabetes mellitus: Secondary | ICD-10-CM | POA: Diagnosis not present

## 2020-09-12 DIAGNOSIS — Z90721 Acquired absence of ovaries, unilateral: Secondary | ICD-10-CM | POA: Insufficient documentation

## 2020-09-12 DIAGNOSIS — R918 Other nonspecific abnormal finding of lung field: Secondary | ICD-10-CM | POA: Diagnosis not present

## 2020-09-12 DIAGNOSIS — C419 Malignant neoplasm of bone and articular cartilage, unspecified: Secondary | ICD-10-CM | POA: Diagnosis not present

## 2020-09-12 LAB — CBC WITH DIFFERENTIAL/PLATELET
Abs Immature Granulocytes: 0.02 10*3/uL (ref 0.00–0.07)
Basophils Absolute: 0 10*3/uL (ref 0.0–0.1)
Basophils Relative: 1 %
Eosinophils Absolute: 0.1 10*3/uL (ref 0.0–0.5)
Eosinophils Relative: 2 %
HCT: 41.2 % (ref 36.0–46.0)
Hemoglobin: 13.6 g/dL (ref 12.0–15.0)
Immature Granulocytes: 1 %
Lymphocytes Relative: 23 %
Lymphs Abs: 1 10*3/uL (ref 0.7–4.0)
MCH: 28.6 pg (ref 26.0–34.0)
MCHC: 33 g/dL (ref 30.0–36.0)
MCV: 86.7 fL (ref 80.0–100.0)
Monocytes Absolute: 0.3 10*3/uL (ref 0.1–1.0)
Monocytes Relative: 8 %
Neutro Abs: 2.9 10*3/uL (ref 1.7–7.7)
Neutrophils Relative %: 65 %
Platelets: 231 10*3/uL (ref 150–400)
RBC: 4.75 MIL/uL (ref 3.87–5.11)
RDW: 13 % (ref 11.5–15.5)
WBC: 4.3 10*3/uL (ref 4.0–10.5)
nRBC: 0 % (ref 0.0–0.2)

## 2020-09-12 LAB — COMPREHENSIVE METABOLIC PANEL
ALT: 12 U/L (ref 0–44)
AST: 16 U/L (ref 15–41)
Albumin: 4.2 g/dL (ref 3.5–5.0)
Alkaline Phosphatase: 79 U/L (ref 38–126)
Anion gap: 7 (ref 5–15)
BUN: 18 mg/dL (ref 8–23)
CO2: 29 mmol/L (ref 22–32)
Calcium: 9.9 mg/dL (ref 8.9–10.3)
Chloride: 100 mmol/L (ref 98–111)
Creatinine, Ser: 0.7 mg/dL (ref 0.44–1.00)
GFR, Estimated: >60 mL/min (ref >60)
Glucose, Bld: 123 mg/dL — ABNORMAL HIGH (ref 70–99)
Potassium: 4.3 mmol/L (ref 3.5–5.1)
Sodium: 136 mmol/L (ref 135–145)
Total Bilirubin: 0.5 mg/dL (ref 0.3–1.2)
Total Protein: 7.2 g/dL (ref 6.5–8.1)

## 2020-09-12 LAB — LACTATE DEHYDROGENASE: LDH: 125 U/L (ref 98–192)

## 2020-09-12 NOTE — Progress Notes (Signed)
[facility] OFFICE PROGRESS NOTE  Patient Care Team: Ezequiel Kayser, MD as PCP - General (Internal Medicine) Bary Castilla, [place] Gleason, MD (General Surgery) Cammie Sickle, MD as Consulting Physician (Internal Medicine)  Cancer Staging No matching staging information was found for the patient.   Oncology History Overview Note  Chief Complaint/Problem List  Breast cancer: T2N0M0, stage II. HER-2/neu 3 +. Estrogen receptor greater than 90%. Progesterone receptor greater than 90%.    Previous therapy:   1. Right mastectomy and axillary lymph node dissection- s/p. Adriamycin and Cytoxan.  3. Tamoxifen 20 mg daily  4. Vaginal bleeding on Tamoxifen in November 2004.   5. Aromasin 25 mg daily starting January 2005. 6. Finished aromosin   was seen in November of 2007, and has refused NSABP B. 42 study  7. Left thigh sarcoma, 2009 [[facility]; s/p Sarcoma spl; Dr.Savage; [facility]; followed by local recurrence s/p RT; Dr.Crystal; no chemo]; last MRI dec 2017- [facility]   # 2015- Bil Lung nodules [1.3-1.7cm; CXR- April 2018; Dr.Savage]  -----------------------------------------------------  S/p resection ([place]) July 2009. 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 Focal margin positivity.Three benign appearing lymph nodes.  Surveillance plan: MRI q 6 month through 2014, then annually.  DIAGNOSIS: Extraskeletal myxoid chondrosarcoma /lung nodules   STAGE:   4      ;GOALS: Control  CURRENT/MOST RECENT THERAPY: Surveillance    Malignant neoplasm of skeletal system (H[facility])  Carcinoma of overlapping sites of right breast in female, estrogen receptor positive ([name])      INTERVAL HISTORY:  Danielle Hansen 70 y.o.  female pleasant patient above history of sarcoma/lung nodules; also history of breast cancer is here for follow-up.  Patient denies any worsening shortness of breath or cough.  Denies any nausea vomiting.  Denies any  worsening back pain or bone pain.  She continues to have chronic joint arthritis.  Unfortunately patient has gained weight because of dietary indiscretion.  Denies any abdominal distention or swelling in the legs.     Review of Systems  Constitutional:  Negative for chills, diaphoresis, fever, malaise/fatigue and weight loss.  HENT:  Negative for nosebleeds and sore throat.   Eyes:  Negative for double vision.  Respiratory:  Negative for cough, hemoptysis, sputum production, shortness of breath and wheezing.   Cardiovascular:  Negative for chest pain, palpitations, orthopnea and leg swelling.  Gastrointestinal:  Negative for abdominal pain, blood in stool, constipation, diarrhea, heartburn, melena, nausea and vomiting.  Genitourinary:  Negative for dysuria, frequency and urgency.  Musculoskeletal:  Positive for joint pain. Negative for back pain.  Skin: Negative.  Negative for itching and rash.  Neurological:  Negative for dizziness, tingling, focal weakness, weakness and headaches.  Endo/Heme/Allergies:  Does not bruise/bleed easily.  Psychiatric/Behavioral:  Negative for depression. The patient is not nervous/anxious and does not have insomnia.      PAST MEDICAL HISTORY :  Past Medical History:  Diagnosis Date  . Arthritis   . Breast cancer ([place]) 2002   rt mastectomy/chemo  . Cancer ([place]) 09/09/00   rt mastectomy-09/09/00  . 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 Degenerative disc disease, cervical    no limitations  . Diabetes mellitus without complication ([place])   . GERD (gastroesophageal reflux disease)   . Hypertension   . Hypothyroid   . Lung nodule    Dr Kendall Flack  . Personal history of chemotherapy   . 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 Uterine fibroid     PAST SURGICAL HISTORY :   Past Surgical History:  Procedure Laterality Date  . BREAST CYST ASPIRATION Left 09/30/2016   FNA done at Dr.  Dwyane Luo office per notes   . BUNIONECTOMY Right 2017  . COLONOSCOPY  2014   Verdie Shire, MD  . COLONOSCOPY WITH PROPOFOL N/A 02/01/2018   Procedure: COLONOSCOPY WITH PROPOFOL;  Surgeon: Toledo, Benay Pike, MD;  Location: [facility] ENDOSCOPY;  Service: Gastroenterology;  Laterality: N/A;  . FRACTURE SURGERY Right 12/2016   [facility]  . HALLUX VALGUS LAPIDUS Right 05/08/2015   Procedure: LAPIPLASTY 1ST METATARSAL GREAT TOE;  Surgeon: Albertine Patricia, DPM;  Location: [place];  Service: Podiatry;  Laterality: Right;  . KNEE ARTHROSCOPY Right 05/03  . MASTECTOMY Right 09/09/00   09/09/00-Dr Pat Patrick mastectomy/chemo  . OOPHORECTOMY Right 1990   right tube and ovary removed  . SALPINGECTOMY Right 1990  . SOFT TISSUE TUMOR RESECTION Left 7/09, 6/11   gluteal    FAMILY HISTORY :   Family History  Problem Relation Age of Onset  . Breast cancer Sister 6       2004, 2011  . Heart disease Sister   . Diabetes Father   . Hypertension Father   . Heart disease Brother   . Uterine cancer Sister 7  . Diabetes Sister   . Ovarian cancer Neg Hx   . Colon cancer Neg Hx     SOCIAL HISTORY:   Social History   Tobacco Use  . Smoking status: Never  . Smokeless tobacco: Never  Vaping Use  . Vaping Use: Never used  Substance Use Topics  . Alcohol use: No    Alcohol/week: 0.0 standard drinks  . Drug use: No    ALLERGIES:  has No Known Allergies.  MEDICATIONS:  Current Outpatient Medications  Medication Sig Dispense Refill  . acetaminophen (TYLENOL) 325 MG tablet Take 650 mg by mouth every 6 (six) hours as needed.    . Calcium Carbonate-Vitamin D (CALCIUM + D PO) Take 1 tablet by mouth daily. CALCITRATE/VITAMIN D 315-200 MG-UNIT TABSGeneric: CALCIUM CITRATE-VITAMIN Dtwo by mouth every morning and one by mouth every evening     . cyclobenzaprine (FLEXERIL) 5 MG tablet Take 5 mg by mouth 3 (three) times daily as needed.     . ferrous sulfate 325 (65 FE) MG EC tablet Take 325 mg by mouth 3 (three)  times daily with meals.    . hydrochlorothiazide (HYDRODIURIL) 25 MG tablet 25 mg.    . levothyroxine (SYNTHROID, LEVOTHROID) 125 MCG tablet Take 125 mcg by mouth daily before breakfast.    . metFORMIN (GLUCOPHAGE) 500 MG tablet Take 500 mg by mouth daily.     Marland Kitchen omeprazole (PRILOSEC) 20 MG capsule Take 20 mg by mouth daily.     . simvastatin (ZOCOR) 20 MG tablet Take 20 mg by mouth daily.     . vitamin B-12 (CYANOCOBALAMIN) 1000 MCG tablet Take 1 tablet by mouth daily.     No current facility-administered medications for this visit.    PHYSICAL EXAMINATION: ECOG PERFORMANCE STATUS: 0 - Asymptomatic  BP 128/82   Pulse 73   Temp (!) 96.7 F (35.9 C) (Tympanic)   Resp 17   Wt 236 lb (107 kg)   SpO2 100%   BMI 34.85 kg/m   Filed Weights   09/12/20 0831  Weight: 236 lb (107 kg)   Physical Exam HENT:     Head: Normocephalic and  atraumatic.     Mouth/Throat:     Pharynx: No oropharyngeal exudate.  Eyes:     Pupils: Pupils are equal, round, and reactive to light.  Cardiovascular:     Rate and Rhythm: Normal rate and regular rhythm.  Pulmonary:     Effort: Pulmonary effort is normal. No respiratory distress.     Breath sounds: Normal breath sounds. No wheezing.  Abdominal:     General: Bowel sounds are normal. There is no distension.     Palpations: Abdomen is soft. There is no mass.     Tenderness: There is no abdominal tenderness. There is no guarding or rebound.  Musculoskeletal:        General: No tenderness. Normal range of motion.     Cervical back: Normal range of motion and neck supple.  Skin:    General: Skin is warm.     Comments: Right -mastectomy; no evidence of any subcutaneous masses or skin rash.  LEFT BREAST exam [in the presence of nurse]- no unusual skin changes or dominant masses felt. Surgical scars noted.    Neurological:     Mental Status: She is alert and oriented to person, place, and time.  Psychiatric:        Mood and Affect: Affect normal.      LABORATORY DATA:  I have reviewed the data as listed    Component Value Date/Time   NA 136 09/12/2020 0817   NA 137 08/03/2013 0838   K 4.3 09/12/2020 0817   K 3.4 (L) 08/03/2013 0838   CL 100 09/12/2020 0817   CL 98 08/03/2013 0838   CO2 29 09/12/2020 0817   CO2 32 08/03/2013 0838   GLUCOSE 123 (H) 09/12/2020 0817   GLUCOSE 121 (H) 08/03/2013 0838   BUN 18 09/12/2020 0817   BUN 12 08/03/2013 0838   CREATININE 0.70 09/12/2020 0817   CREATININE 0.90 08/03/2013 0838   CALCIUM 9.9 09/12/2020 0817   CALCIUM 9.5 08/03/2013 0838   PROT 7.2 09/12/2020 0817   PROT 7.9 08/03/2013 0838   ALBUMIN 4.2 09/12/2020 0817   ALBUMIN 3.8 08/03/2013 0838   AST 16 09/12/2020 0817   AST 18 08/03/2013 0838   ALT 12 09/12/2020 0817   ALT 23 08/03/2013 0838   ALKPHOS 79 09/12/2020 0817   ALKPHOS 115 08/03/2013 0838   BILITOT 0.5 09/12/2020 0817   BILITOT 0.4 08/03/2013 0838   GFRNONAA >60 09/12/2020 0817   GFRNONAA >60 08/03/2013 0838   GFRAA >60 09/13/2019 0907   GFRAA >60 08/03/2013 0838    No results found for: SPEP, UPEP  Lab Results  Component Value Date   WBC 4.3 09/12/2020   NEUTROABS 2.9 09/12/2020   HGB 13.6 09/12/2020   HCT 41.2 09/12/2020   MCV 86.7 09/12/2020   PLT 231 09/12/2020      Chemistry      Component Value Date/Time   NA 136 09/12/2020 0817   NA 137 08/03/2013 0838   K 4.3 09/12/2020 0817   K 3.4 (L) 08/03/2013 0838   CL 100 09/12/2020 0817   CL 98 08/03/2013 0838   CO2 29 09/12/2020 0817   CO2 32 08/03/2013 0838   BUN 18 09/12/2020 0817   BUN 12 08/03/2013 0838   CREATININE 0.70 09/12/2020 0817   CREATININE 0.90 08/03/2013 0838      Component Value Date/Time   CALCIUM 9.9 09/12/2020 0817   CALCIUM 9.5 08/03/2013 0838   ALKPHOS 79 09/12/2020 0817   ALKPHOS 115 08/03/2013 0175  AST 16 09/12/2020 0817   AST 18 08/03/2013 0838   ALT 12 09/12/2020 0817   ALT 23 08/03/2013 0838   BILITOT 0.5 09/12/2020 0817   BILITOT 0.4 08/03/2013 0838        RADIOGRAPHIC STUDIES: I have personally reviewed the radiological images as listed and agreed with the findings in the report. No results found.   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  Breast exam within normal limits.  STABLE. Ordered mammogram from August 2022.   #Diabetes well controlled-on oral hypoglycemic agents.  Stable  #Weight gain/Obesity:-patient has gained significant weight over the last year or so-which she attributes to dietary indiscretion.  Discussed importance of healthy weight/eating more green leafy vegetables/and cutting down carbs; increasing protein in the diet.  Studies have shown that obviously optimal weight would help cardiovascular risk; also shown to cut on the risk of malignancies.  # DISPOSITION:  # Mammogram left screening in second week of august-  # Follow-up-12 months-  MD/labs-cbc/cmp/ldh- Dr.B   Orders Placed This Encounter  Procedures  . MM 3D SCREEN BREAST UNI LEFT    Standing Status:   Future    Standing Expiration Date:   09/12/2021    Order Specific Question:   Reason for Exam (SYMPTOM  OR DIAGNOSIS REQUIRED)    Answer:   history of breast cancer    Order Specific Question:   Preferred imaging location?    Answer:    [facility]  . CBC    Standing Status:   Future    Standing Expiration Date:   09/12/2021  . Comprehensive metabolic panel    Standing Status:   Future    Standing Expiration Date:   09/12/2021  . Lactate dehydrogenase    Standing Status:   Future    Standing Expiration Date:   09/12/2021   All questions were answered. The  patient knows to call the clinic with any problems, questions or concerns.      Cammie Sickle, MD 09/12/2020 9:17 AM

## 2020-09-12 NOTE — Progress Notes (Signed)
No new concerns today other than joint pain

## 2020-09-12 NOTE — Assessment & Plan Note (Addendum)
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  Breast exam within normal limits.  STABLE. Ordered mammogram from August 2022.   #Diabetes well controlled-on oral hypoglycemic agents.  Stable  #Weight gain/Obesity:-patient has gained significant weight over the last year or so-which she attributes to dietary indiscretion.  Discussed importance of healthy weight/eating more green leafy vegetables/and cutting down carbs; increasing protein in the diet.  Studies have shown that obviously optimal weight would help cardiovascular risk; also shown to cut on the risk of malignancies.  # DISPOSITION:  # Mammogram left screening in second week of august-  # Follow-up-12 months-  MD/labs-cbc/cmp/ldh- Dr.B

## 2020-10-09 ENCOUNTER — Ambulatory Visit
Admission: RE | Admit: 2020-10-09 | Discharge: 2020-10-09 | Disposition: A | Discharge status: Home / Self Care | Payer: Medicare Other | Source: Ambulatory Visit | Attending: Internal Medicine | Admitting: Internal Medicine

## 2020-10-09 ENCOUNTER — Other Ambulatory Visit: Payer: Self-pay

## 2020-10-09 DIAGNOSIS — Z1231 Encounter for screening mammogram for malignant neoplasm of breast: Secondary | ICD-10-CM | POA: Insufficient documentation

## 2020-11-24 DIAGNOSIS — Z23 Encounter for immunization: Secondary | ICD-10-CM | POA: Diagnosis not present

## 2021-03-27 ENCOUNTER — Encounter: Payer: Medicare Other | Admitting: Obstetrics and Gynecology

## 2021-05-19 ENCOUNTER — Other Ambulatory Visit
Admission: RE | Admit: 2021-05-19 | Discharge: 2021-05-19 | Disposition: A | Discharge status: Home / Self Care | Payer: Medicare Other | Attending: Internal Medicine | Admitting: Internal Medicine

## 2021-05-19 DIAGNOSIS — C499 Malignant neoplasm of connective and soft tissue, unspecified: Secondary | ICD-10-CM | POA: Insufficient documentation

## 2021-05-19 LAB — COMPREHENSIVE METABOLIC PANEL
ALT: 14 U/L (ref 0–44)
AST: 17 U/L (ref 15–41)
Albumin: 4.1 g/dL (ref 3.5–5.0)
Alkaline Phosphatase: 98 U/L (ref 38–126)
Anion gap: 10 (ref 5–15)
BUN: 20 mg/dL (ref 8–23)
CO2: 27 mmol/L (ref 22–32)
Calcium: 9.5 mg/dL (ref 8.9–10.3)
Chloride: 99 mmol/L (ref 98–111)
Creatinine, Ser: 0.97 mg/dL (ref 0.44–1.00)
GFR, Estimated: >60 mL/min (ref >60)
Glucose, Bld: 149 mg/dL — ABNORMAL HIGH (ref 70–99)
Potassium: 3.9 mmol/L (ref 3.5–5.1)
Sodium: 136 mmol/L (ref 135–145)
Total Bilirubin: 0.6 mg/dL (ref 0.3–1.2)
Total Protein: 7.7 g/dL (ref 6.5–8.1)

## 2021-05-19 LAB — CBC WITH DIFFERENTIAL/PLATELET
Abs Immature Granulocytes: 0.03 10*3/uL (ref 0.00–0.07)
Basophils Absolute: 0 10*3/uL (ref 0.0–0.1)
Basophils Relative: 0 %
Eosinophils Absolute: 0.1 10*3/uL (ref 0.0–0.5)
Eosinophils Relative: 1 %
HCT: 44.9 % (ref 36.0–46.0)
Hemoglobin: 14.8 g/dL (ref 12.0–15.0)
Immature Granulocytes: 1 %
Lymphocytes Relative: 19 %
Lymphs Abs: 1.1 10*3/uL (ref 0.7–4.0)
MCH: 27.1 pg (ref 26.0–34.0)
MCHC: 33 g/dL (ref 30.0–36.0)
MCV: 82.1 fL (ref 80.0–100.0)
Monocytes Absolute: 0.3 10*3/uL (ref 0.1–1.0)
Monocytes Relative: 6 %
Neutro Abs: 4.4 10*3/uL (ref 1.7–7.7)
Neutrophils Relative %: 73 %
Platelets: 285 10*3/uL (ref 150–400)
RBC: 5.47 MIL/uL — ABNORMAL HIGH (ref 3.87–5.11)
RDW: 12.9 % (ref 11.5–15.5)
WBC: 6 10*3/uL (ref 4.0–10.5)
nRBC: 0 % (ref 0.0–0.2)

## 2021-05-19 LAB — LACTATE DEHYDROGENASE: LDH: 129 U/L (ref 98–192)

## 2021-05-25 ENCOUNTER — Other Ambulatory Visit
Admission: RE | Admit: 2021-05-25 | Discharge: 2021-05-25 | Disposition: A | Discharge status: Home / Self Care | Payer: Medicare Other | Attending: Internal Medicine | Admitting: Internal Medicine

## 2021-05-25 DIAGNOSIS — C499 Malignant neoplasm of connective and soft tissue, unspecified: Secondary | ICD-10-CM | POA: Insufficient documentation

## 2021-05-25 LAB — COMPREHENSIVE METABOLIC PANEL
ALT: 12 U/L (ref 0–44)
AST: 15 U/L (ref 15–41)
Albumin: 3.9 g/dL (ref 3.5–5.0)
Alkaline Phosphatase: 99 U/L (ref 38–126)
Anion gap: 11 (ref 5–15)
BUN: 17 mg/dL (ref 8–23)
CO2: 27 mmol/L (ref 22–32)
Calcium: 9.4 mg/dL (ref 8.9–10.3)
Chloride: 100 mmol/L (ref 98–111)
Creatinine, Ser: 0.78 mg/dL (ref 0.44–1.00)
GFR, Estimated: >60 mL/min (ref >60)
Glucose, Bld: 136 mg/dL — ABNORMAL HIGH (ref 70–99)
Potassium: 3.7 mmol/L (ref 3.5–5.1)
Sodium: 138 mmol/L (ref 135–145)
Total Bilirubin: 0.7 mg/dL (ref 0.3–1.2)
Total Protein: 7.2 g/dL (ref 6.5–8.1)

## 2021-05-25 LAB — CBC WITH DIFFERENTIAL/PLATELET
Abs Immature Granulocytes: 0.02 10*3/uL (ref 0.00–0.07)
Basophils Absolute: 0 10*3/uL (ref 0.0–0.1)
Basophils Relative: 0 %
Eosinophils Absolute: 0.1 10*3/uL (ref 0.0–0.5)
Eosinophils Relative: 2 %
HCT: 42 % (ref 36.0–46.0)
Hemoglobin: 13.6 g/dL (ref 12.0–15.0)
Immature Granulocytes: 0 %
Lymphocytes Relative: 24 %
Lymphs Abs: 1.2 10*3/uL (ref 0.7–4.0)
MCH: 27.3 pg (ref 26.0–34.0)
MCHC: 32.4 g/dL (ref 30.0–36.0)
MCV: 84.2 fL (ref 80.0–100.0)
Monocytes Absolute: 0.3 10*3/uL (ref 0.1–1.0)
Monocytes Relative: 5 %
Neutro Abs: 3.4 10*3/uL (ref 1.7–7.7)
Neutrophils Relative %: 69 %
Platelets: 254 10*3/uL (ref 150–400)
RBC: 4.99 MIL/uL (ref 3.87–5.11)
RDW: 13.1 % (ref 11.5–15.5)
WBC: 5 10*3/uL (ref 4.0–10.5)
nRBC: 0 % (ref 0.0–0.2)

## 2021-05-25 LAB — LACTATE DEHYDROGENASE: LDH: 121 U/L (ref 98–192)

## 2021-06-01 ENCOUNTER — Other Ambulatory Visit
Admission: RE | Admit: 2021-06-01 | Discharge: 2021-06-01 | Disposition: A | Discharge status: Home / Self Care | Payer: Medicare Other | Attending: Internal Medicine | Admitting: Internal Medicine

## 2021-06-01 DIAGNOSIS — C499 Malignant neoplasm of connective and soft tissue, unspecified: Secondary | ICD-10-CM | POA: Diagnosis present

## 2021-06-01 LAB — CBC WITH DIFFERENTIAL/PLATELET
Abs Immature Granulocytes: 0.03 10*3/uL (ref 0.00–0.07)
Basophils Absolute: 0 10*3/uL (ref 0.0–0.1)
Basophils Relative: 0 %
Eosinophils Absolute: 0.1 10*3/uL (ref 0.0–0.5)
Eosinophils Relative: 1 %
HCT: 44 % (ref 36.0–46.0)
Hemoglobin: 14.4 g/dL (ref 12.0–15.0)
Immature Granulocytes: 1 %
Lymphocytes Relative: 23 %
Lymphs Abs: 1.1 10*3/uL (ref 0.7–4.0)
MCH: 27.4 pg (ref 26.0–34.0)
MCHC: 32.7 g/dL (ref 30.0–36.0)
MCV: 83.7 fL (ref 80.0–100.0)
Monocytes Absolute: 0.3 10*3/uL (ref 0.1–1.0)
Monocytes Relative: 5 %
Neutro Abs: 3.4 10*3/uL (ref 1.7–7.7)
Neutrophils Relative %: 70 %
Platelets: 235 10*3/uL (ref 150–400)
RBC: 5.26 MIL/uL — ABNORMAL HIGH (ref 3.87–5.11)
RDW: 13.5 % (ref 11.5–15.5)
WBC: 4.9 10*3/uL (ref 4.0–10.5)
nRBC: 0 % (ref 0.0–0.2)

## 2021-06-01 LAB — COMPREHENSIVE METABOLIC PANEL
ALT: 12 U/L (ref 0–44)
AST: 16 U/L (ref 15–41)
Albumin: 4.2 g/dL (ref 3.5–5.0)
Alkaline Phosphatase: 100 U/L (ref 38–126)
Anion gap: 7 (ref 5–15)
BUN: 20 mg/dL (ref 8–23)
CO2: 32 mmol/L (ref 22–32)
Calcium: 9.6 mg/dL (ref 8.9–10.3)
Chloride: 99 mmol/L (ref 98–111)
Creatinine, Ser: 0.83 mg/dL (ref 0.44–1.00)
GFR, Estimated: >60 mL/min (ref >60)
Glucose, Bld: 179 mg/dL — ABNORMAL HIGH (ref 70–99)
Potassium: 4.1 mmol/L (ref 3.5–5.1)
Sodium: 138 mmol/L (ref 135–145)
Total Bilirubin: 0.8 mg/dL (ref 0.3–1.2)
Total Protein: 7.8 g/dL (ref 6.5–8.1)

## 2021-06-01 LAB — LACTATE DEHYDROGENASE: LDH: 129 U/L (ref 98–192)

## 2021-06-18 ENCOUNTER — Other Ambulatory Visit
Admission: RE | Admit: 2021-06-18 | Discharge: 2021-06-18 | Disposition: A | Discharge status: Home / Self Care | Payer: Medicare Other | Source: Ambulatory Visit | Attending: Internal Medicine | Admitting: Internal Medicine

## 2021-06-18 DIAGNOSIS — C419 Malignant neoplasm of bone and articular cartilage, unspecified: Secondary | ICD-10-CM | POA: Insufficient documentation

## 2021-06-18 LAB — COMPREHENSIVE METABOLIC PANEL
ALT: 116 U/L — ABNORMAL HIGH (ref 0–44)
AST: 76 U/L — ABNORMAL HIGH (ref 15–41)
Albumin: 4.3 g/dL (ref 3.5–5.0)
Alkaline Phosphatase: 89 U/L (ref 38–126)
Anion gap: 9 (ref 5–15)
BUN: 11 mg/dL (ref 8–23)
CO2: 30 mmol/L (ref 22–32)
Calcium: 9.7 mg/dL (ref 8.9–10.3)
Chloride: 99 mmol/L (ref 98–111)
Creatinine, Ser: 0.81 mg/dL (ref 0.44–1.00)
GFR, Estimated: >60 mL/min (ref >60)
Glucose, Bld: 120 mg/dL — ABNORMAL HIGH (ref 70–99)
Potassium: 4.4 mmol/L (ref 3.5–5.1)
Sodium: 138 mmol/L (ref 135–145)
Total Bilirubin: 0.7 mg/dL (ref 0.3–1.2)
Total Protein: 7.8 g/dL (ref 6.5–8.1)

## 2021-06-18 LAB — CBC WITH DIFFERENTIAL/PLATELET
Abs Immature Granulocytes: 0.03 10*3/uL (ref 0.00–0.07)
Basophils Absolute: 0 10*3/uL (ref 0.0–0.1)
Basophils Relative: 1 %
Eosinophils Absolute: 0.1 10*3/uL (ref 0.0–0.5)
Eosinophils Relative: 2 %
HCT: 43 % (ref 36.0–46.0)
Hemoglobin: 14.4 g/dL (ref 12.0–15.0)
Immature Granulocytes: 1 %
Lymphocytes Relative: 24 %
Lymphs Abs: 1.2 10*3/uL (ref 0.7–4.0)
MCH: 28 pg (ref 26.0–34.0)
MCHC: 33.5 g/dL (ref 30.0–36.0)
MCV: 83.7 fL (ref 80.0–100.0)
Monocytes Absolute: 0.3 10*3/uL (ref 0.1–1.0)
Monocytes Relative: 5 %
Neutro Abs: 3.4 10*3/uL (ref 1.7–7.7)
Neutrophils Relative %: 67 %
Platelets: 235 10*3/uL (ref 150–400)
RBC: 5.14 MIL/uL — ABNORMAL HIGH (ref 3.87–5.11)
RDW: 13.7 % (ref 11.5–15.5)
WBC: 5 10*3/uL (ref 4.0–10.5)
nRBC: 0 % (ref 0.0–0.2)

## 2021-06-18 LAB — LACTATE DEHYDROGENASE: LDH: 182 U/L (ref 98–192)

## 2021-06-23 ENCOUNTER — Other Ambulatory Visit
Admission: RE | Admit: 2021-06-23 | Discharge: 2021-06-23 | Disposition: A | Discharge status: Home / Self Care | Payer: Medicare Other | Source: Ambulatory Visit | Attending: Internal Medicine | Admitting: Internal Medicine

## 2021-06-23 DIAGNOSIS — C499 Malignant neoplasm of connective and soft tissue, unspecified: Secondary | ICD-10-CM | POA: Insufficient documentation

## 2021-06-23 LAB — CBC WITH DIFFERENTIAL/PLATELET
Abs Immature Granulocytes: 0.02 10*3/uL (ref 0.00–0.07)
Basophils Absolute: 0 10*3/uL (ref 0.0–0.1)
Basophils Relative: 0 %
Eosinophils Absolute: 0.1 10*3/uL (ref 0.0–0.5)
Eosinophils Relative: 2 %
HCT: 43.4 % (ref 36.0–46.0)
Hemoglobin: 14.6 g/dL (ref 12.0–15.0)
Immature Granulocytes: 0 %
Lymphocytes Relative: 26 %
Lymphs Abs: 1.5 10*3/uL (ref 0.7–4.0)
MCH: 28.1 pg (ref 26.0–34.0)
MCHC: 33.6 g/dL (ref 30.0–36.0)
MCV: 83.5 fL (ref 80.0–100.0)
Monocytes Absolute: 0.3 10*3/uL (ref 0.1–1.0)
Monocytes Relative: 5 %
Neutro Abs: 4 10*3/uL (ref 1.7–7.7)
Neutrophils Relative %: 67 %
Platelets: 284 10*3/uL (ref 150–400)
RBC: 5.2 MIL/uL — ABNORMAL HIGH (ref 3.87–5.11)
RDW: 14.3 % (ref 11.5–15.5)
WBC: 6 10*3/uL (ref 4.0–10.5)
nRBC: 0 % (ref 0.0–0.2)

## 2021-06-23 LAB — COMPREHENSIVE METABOLIC PANEL
ALT: 116 U/L — ABNORMAL HIGH (ref 0–44)
AST: 63 U/L — ABNORMAL HIGH (ref 15–41)
Albumin: 4.2 g/dL (ref 3.5–5.0)
Alkaline Phosphatase: 99 U/L (ref 38–126)
Anion gap: 10 (ref 5–15)
BUN: 15 mg/dL (ref 8–23)
CO2: 27 mmol/L (ref 22–32)
Calcium: 9.7 mg/dL (ref 8.9–10.3)
Chloride: 98 mmol/L (ref 98–111)
Creatinine, Ser: 0.77 mg/dL (ref 0.44–1.00)
GFR, Estimated: >60 mL/min (ref >60)
Glucose, Bld: 127 mg/dL — ABNORMAL HIGH (ref 70–99)
Potassium: 4 mmol/L (ref 3.5–5.1)
Sodium: 135 mmol/L (ref 135–145)
Total Bilirubin: 0.6 mg/dL (ref 0.3–1.2)
Total Protein: 7.7 g/dL (ref 6.5–8.1)

## 2021-06-23 LAB — LACTATE DEHYDROGENASE: LDH: 174 U/L (ref 98–192)

## 2021-06-30 ENCOUNTER — Other Ambulatory Visit
Admission: RE | Admit: 2021-06-30 | Discharge: 2021-06-30 | Disposition: A | Discharge status: Home / Self Care | Payer: Medicare Other | Attending: Internal Medicine | Admitting: Internal Medicine

## 2021-06-30 DIAGNOSIS — C499 Malignant neoplasm of connective and soft tissue, unspecified: Secondary | ICD-10-CM | POA: Insufficient documentation

## 2021-06-30 LAB — CBC WITH DIFFERENTIAL/PLATELET
Abs Immature Granulocytes: 0.02 10*3/uL (ref 0.00–0.07)
Basophils Absolute: 0 10*3/uL (ref 0.0–0.1)
Basophils Relative: 0 %
Eosinophils Absolute: 0.1 10*3/uL (ref 0.0–0.5)
Eosinophils Relative: 3 %
HCT: 42.7 % (ref 36.0–46.0)
Hemoglobin: 14.4 g/dL (ref 12.0–15.0)
Immature Granulocytes: 0 %
Lymphocytes Relative: 26 %
Lymphs Abs: 1.2 10*3/uL (ref 0.7–4.0)
MCH: 28.2 pg (ref 26.0–34.0)
MCHC: 33.7 g/dL (ref 30.0–36.0)
MCV: 83.6 fL (ref 80.0–100.0)
Monocytes Absolute: 0.3 10*3/uL (ref 0.1–1.0)
Monocytes Relative: 5 %
Neutro Abs: 3 10*3/uL (ref 1.7–7.7)
Neutrophils Relative %: 66 %
Platelets: 251 10*3/uL (ref 150–400)
RBC: 5.11 MIL/uL (ref 3.87–5.11)
RDW: 14.4 % (ref 11.5–15.5)
WBC: 4.7 10*3/uL (ref 4.0–10.5)
nRBC: 0 % (ref 0.0–0.2)

## 2021-06-30 LAB — COMPREHENSIVE METABOLIC PANEL
ALT: 88 U/L — ABNORMAL HIGH (ref 0–44)
AST: 51 U/L — ABNORMAL HIGH (ref 15–41)
Albumin: 4.2 g/dL (ref 3.5–5.0)
Alkaline Phosphatase: 84 U/L (ref 38–126)
Anion gap: 9 (ref 5–15)
BUN: 18 mg/dL (ref 8–23)
CO2: 27 mmol/L (ref 22–32)
Calcium: 9.5 mg/dL (ref 8.9–10.3)
Chloride: 100 mmol/L (ref 98–111)
Creatinine, Ser: 0.76 mg/dL (ref 0.44–1.00)
GFR, Estimated: >60 mL/min (ref >60)
Glucose, Bld: 112 mg/dL — ABNORMAL HIGH (ref 70–99)
Potassium: 4 mmol/L (ref 3.5–5.1)
Sodium: 136 mmol/L (ref 135–145)
Total Bilirubin: 0.9 mg/dL (ref 0.3–1.2)
Total Protein: 7.9 g/dL (ref 6.5–8.1)

## 2021-06-30 LAB — LACTATE DEHYDROGENASE: LDH: 209 U/L — ABNORMAL HIGH (ref 98–192)

## 2021-07-07 ENCOUNTER — Other Ambulatory Visit
Admission: RE | Admit: 2021-07-07 | Discharge: 2021-07-07 | Disposition: A | Discharge status: Home / Self Care | Payer: Medicare Other | Attending: Internal Medicine | Admitting: Internal Medicine

## 2021-07-07 DIAGNOSIS — C499 Malignant neoplasm of connective and soft tissue, unspecified: Secondary | ICD-10-CM | POA: Diagnosis not present

## 2021-07-07 DIAGNOSIS — C419 Malignant neoplasm of bone and articular cartilage, unspecified: Secondary | ICD-10-CM | POA: Diagnosis present

## 2021-07-07 LAB — CBC WITH DIFFERENTIAL/PLATELET
Abs Immature Granulocytes: 0.02 10*3/uL (ref 0.00–0.07)
Basophils Absolute: 0 10*3/uL (ref 0.0–0.1)
Basophils Relative: 0 %
Eosinophils Absolute: 0.1 10*3/uL (ref 0.0–0.5)
Eosinophils Relative: 2 %
HCT: 41.4 % (ref 36.0–46.0)
Hemoglobin: 14.3 g/dL (ref 12.0–15.0)
Immature Granulocytes: 0 %
Lymphocytes Relative: 24 %
Lymphs Abs: 1.3 10*3/uL (ref 0.7–4.0)
MCH: 28 pg (ref 26.0–34.0)
MCHC: 34.5 g/dL (ref 30.0–36.0)
MCV: 81.2 fL (ref 80.0–100.0)
Monocytes Absolute: 0.4 10*3/uL (ref 0.1–1.0)
Monocytes Relative: 7 %
Neutro Abs: 3.7 10*3/uL (ref 1.7–7.7)
Neutrophils Relative %: 67 %
Platelets: 230 10*3/uL (ref 150–400)
RBC: 5.1 MIL/uL (ref 3.87–5.11)
RDW: 14.6 % (ref 11.5–15.5)
WBC: 5.6 10*3/uL (ref 4.0–10.5)
nRBC: 0 % (ref 0.0–0.2)

## 2021-07-07 LAB — LACTATE DEHYDROGENASE: LDH: 150 U/L (ref 98–192)

## 2021-07-07 LAB — COMPREHENSIVE METABOLIC PANEL
ALT: 64 U/L — ABNORMAL HIGH (ref 0–44)
AST: 41 U/L (ref 15–41)
Albumin: 4 g/dL (ref 3.5–5.0)
Alkaline Phosphatase: 77 U/L (ref 38–126)
Anion gap: 9 (ref 5–15)
BUN: 16 mg/dL (ref 8–23)
CO2: 26 mmol/L (ref 22–32)
Calcium: 9.5 mg/dL (ref 8.9–10.3)
Chloride: 102 mmol/L (ref 98–111)
Creatinine, Ser: 0.83 mg/dL (ref 0.44–1.00)
GFR, Estimated: >60 mL/min (ref >60)
Glucose, Bld: 100 mg/dL — ABNORMAL HIGH (ref 70–99)
Potassium: 3.6 mmol/L (ref 3.5–5.1)
Sodium: 137 mmol/L (ref 135–145)
Total Bilirubin: 0.8 mg/dL (ref 0.3–1.2)
Total Protein: 7.4 g/dL (ref 6.5–8.1)

## 2021-07-14 ENCOUNTER — Other Ambulatory Visit
Admission: RE | Admit: 2021-07-14 | Discharge: 2021-07-14 | Disposition: A | Discharge status: Home / Self Care | Payer: Medicare Other | Attending: Internal Medicine | Admitting: Internal Medicine

## 2021-07-14 DIAGNOSIS — C499 Malignant neoplasm of connective and soft tissue, unspecified: Secondary | ICD-10-CM | POA: Insufficient documentation

## 2021-07-14 LAB — COMPREHENSIVE METABOLIC PANEL
ALT: 78 U/L — ABNORMAL HIGH (ref 0–44)
AST: 59 U/L — ABNORMAL HIGH (ref 15–41)
Albumin: 4 g/dL (ref 3.5–5.0)
Alkaline Phosphatase: 84 U/L (ref 38–126)
Anion gap: 10 (ref 5–15)
BUN: 13 mg/dL (ref 8–23)
CO2: 28 mmol/L (ref 22–32)
Calcium: 9.8 mg/dL (ref 8.9–10.3)
Chloride: 99 mmol/L (ref 98–111)
Creatinine, Ser: 0.79 mg/dL (ref 0.44–1.00)
GFR, Estimated: >60 mL/min (ref >60)
Glucose, Bld: 126 mg/dL — ABNORMAL HIGH (ref 70–99)
Potassium: 3.9 mmol/L (ref 3.5–5.1)
Sodium: 137 mmol/L (ref 135–145)
Total Bilirubin: 0.8 mg/dL (ref 0.3–1.2)
Total Protein: 7.3 g/dL (ref 6.5–8.1)

## 2021-07-14 LAB — CBC WITH DIFFERENTIAL/PLATELET
Abs Immature Granulocytes: 0.02 10*3/uL (ref 0.00–0.07)
Basophils Absolute: 0 10*3/uL (ref 0.0–0.1)
Basophils Relative: 0 %
Eosinophils Absolute: 0.1 10*3/uL (ref 0.0–0.5)
Eosinophils Relative: 2 %
HCT: 43.2 % (ref 36.0–46.0)
Hemoglobin: 14.5 g/dL (ref 12.0–15.0)
Immature Granulocytes: 0 %
Lymphocytes Relative: 25 %
Lymphs Abs: 1.3 10*3/uL (ref 0.7–4.0)
MCH: 28.1 pg (ref 26.0–34.0)
MCHC: 33.6 g/dL (ref 30.0–36.0)
MCV: 83.7 fL (ref 80.0–100.0)
Monocytes Absolute: 0.4 10*3/uL (ref 0.1–1.0)
Monocytes Relative: 7 %
Neutro Abs: 3.5 10*3/uL (ref 1.7–7.7)
Neutrophils Relative %: 66 %
Platelets: 215 10*3/uL (ref 150–400)
RBC: 5.16 MIL/uL — ABNORMAL HIGH (ref 3.87–5.11)
RDW: 15.2 % (ref 11.5–15.5)
WBC: 5.3 10*3/uL (ref 4.0–10.5)
nRBC: 0 % (ref 0.0–0.2)

## 2021-07-14 LAB — LACTATE DEHYDROGENASE: LDH: 162 U/L (ref 98–192)

## 2021-07-21 ENCOUNTER — Other Ambulatory Visit
Admission: RE | Admit: 2021-07-21 | Discharge: 2021-07-21 | Disposition: A | Discharge status: Home / Self Care | Payer: Medicare Other | Attending: Internal Medicine | Admitting: Internal Medicine

## 2021-07-21 DIAGNOSIS — C499 Malignant neoplasm of connective and soft tissue, unspecified: Secondary | ICD-10-CM | POA: Diagnosis present

## 2021-07-21 LAB — COMPREHENSIVE METABOLIC PANEL
ALT: 116 U/L — ABNORMAL HIGH (ref 0–44)
AST: 80 U/L — ABNORMAL HIGH (ref 15–41)
Albumin: 4 g/dL (ref 3.5–5.0)
Alkaline Phosphatase: 82 U/L (ref 38–126)
Anion gap: 11 (ref 5–15)
BUN: 14 mg/dL (ref 8–23)
CO2: 28 mmol/L (ref 22–32)
Calcium: 9.5 mg/dL (ref 8.9–10.3)
Chloride: 98 mmol/L (ref 98–111)
Creatinine, Ser: 0.84 mg/dL (ref 0.44–1.00)
GFR, Estimated: >60 mL/min (ref >60)
Glucose, Bld: 187 mg/dL — ABNORMAL HIGH (ref 70–99)
Potassium: 4 mmol/L (ref 3.5–5.1)
Sodium: 137 mmol/L (ref 135–145)
Total Bilirubin: 0.9 mg/dL (ref 0.3–1.2)
Total Protein: 7 g/dL (ref 6.5–8.1)

## 2021-07-21 LAB — CBC WITH DIFFERENTIAL/PLATELET
Abs Immature Granulocytes: 0.01 10*3/uL (ref 0.00–0.07)
Basophils Absolute: 0 10*3/uL (ref 0.0–0.1)
Basophils Relative: 0 %
Eosinophils Absolute: 0.1 10*3/uL (ref 0.0–0.5)
Eosinophils Relative: 1 %
HCT: 41.9 % (ref 36.0–46.0)
Hemoglobin: 14.1 g/dL (ref 12.0–15.0)
Immature Granulocytes: 0 %
Lymphocytes Relative: 26 %
Lymphs Abs: 1.3 10*3/uL (ref 0.7–4.0)
MCH: 28.6 pg (ref 26.0–34.0)
MCHC: 33.7 g/dL (ref 30.0–36.0)
MCV: 85 fL (ref 80.0–100.0)
Monocytes Absolute: 0.2 10*3/uL (ref 0.1–1.0)
Monocytes Relative: 5 %
Neutro Abs: 3.3 10*3/uL (ref 1.7–7.7)
Neutrophils Relative %: 68 %
Platelets: 214 10*3/uL (ref 150–400)
RBC: 4.93 MIL/uL (ref 3.87–5.11)
RDW: 15.4 % (ref 11.5–15.5)
WBC: 4.9 10*3/uL (ref 4.0–10.5)
nRBC: 0 % (ref 0.0–0.2)

## 2021-07-21 LAB — LACTATE DEHYDROGENASE: LDH: 168 U/L (ref 98–192)

## 2021-08-10 ENCOUNTER — Inpatient Hospital Stay: Payer: Medicare Other | Attending: Nurse Practitioner

## 2021-08-10 ENCOUNTER — Other Ambulatory Visit: Payer: Self-pay

## 2021-08-10 ENCOUNTER — Encounter: Payer: Self-pay | Admitting: Nurse Practitioner

## 2021-08-10 ENCOUNTER — Inpatient Hospital Stay (HOSPITAL_BASED_OUTPATIENT_CLINIC_OR_DEPARTMENT_OTHER): Payer: Medicare Other | Admitting: Nurse Practitioner

## 2021-08-10 ENCOUNTER — Ambulatory Visit: Payer: Medicare Other | Admitting: Internal Medicine

## 2021-08-10 VITALS — BP 136/96 | HR 98 | Temp 97.8°F | Resp 18 | Ht 69.0 in | Wt 259.4 lb

## 2021-08-10 DIAGNOSIS — Z9011 Acquired absence of right breast and nipple: Secondary | ICD-10-CM | POA: Insufficient documentation

## 2021-08-10 DIAGNOSIS — Z853 Personal history of malignant neoplasm of breast: Secondary | ICD-10-CM | POA: Diagnosis not present

## 2021-08-10 DIAGNOSIS — Z1231 Encounter for screening mammogram for malignant neoplasm of breast: Secondary | ICD-10-CM | POA: Diagnosis not present

## 2021-08-10 DIAGNOSIS — Z803 Family history of malignant neoplasm of breast: Secondary | ICD-10-CM | POA: Insufficient documentation

## 2021-08-10 DIAGNOSIS — Z8049 Family history of malignant neoplasm of other genital organs: Secondary | ICD-10-CM | POA: Insufficient documentation

## 2021-08-10 DIAGNOSIS — I10 Essential (primary) hypertension: Secondary | ICD-10-CM | POA: Insufficient documentation

## 2021-08-10 DIAGNOSIS — R918 Other nonspecific abnormal finding of lung field: Secondary | ICD-10-CM | POA: Insufficient documentation

## 2021-08-10 DIAGNOSIS — C419 Malignant neoplasm of bone and articular cartilage, unspecified: Secondary | ICD-10-CM | POA: Diagnosis not present

## 2021-08-10 DIAGNOSIS — E119 Type 2 diabetes mellitus without complications: Secondary | ICD-10-CM | POA: Insufficient documentation

## 2021-08-10 LAB — COMPREHENSIVE METABOLIC PANEL
ALT: 66 U/L — ABNORMAL HIGH (ref 0–44)
AST: 42 U/L — ABNORMAL HIGH (ref 15–41)
Albumin: 4 g/dL (ref 3.5–5.0)
Alkaline Phosphatase: 88 U/L (ref 38–126)
Anion gap: 8 (ref 5–15)
BUN: 17 mg/dL (ref 8–23)
CO2: 31 mmol/L (ref 22–32)
Calcium: 9.4 mg/dL (ref 8.9–10.3)
Chloride: 99 mmol/L (ref 98–111)
Creatinine, Ser: 0.85 mg/dL (ref 0.44–1.00)
GFR, Estimated: >60 mL/min (ref >60)
Glucose, Bld: 127 mg/dL — ABNORMAL HIGH (ref 70–99)
Potassium: 3.5 mmol/L (ref 3.5–5.1)
Sodium: 138 mmol/L (ref 135–145)
Total Bilirubin: 0.7 mg/dL (ref 0.3–1.2)
Total Protein: 7.5 g/dL (ref 6.5–8.1)

## 2021-08-10 LAB — CBC
HCT: 41.5 % (ref 36.0–46.0)
Hemoglobin: 14.3 g/dL (ref 12.0–15.0)
MCH: 30.2 pg (ref 26.0–34.0)
MCHC: 34.5 g/dL (ref 30.0–36.0)
MCV: 87.7 fL (ref 80.0–100.0)
Platelets: 201 10*3/uL (ref 150–400)
RBC: 4.73 MIL/uL (ref 3.87–5.11)
RDW: 15.6 % — ABNORMAL HIGH (ref 11.5–15.5)
WBC: 6.9 10*3/uL (ref 4.0–10.5)
nRBC: 0 % (ref 0.0–0.2)

## 2021-08-10 LAB — LACTATE DEHYDROGENASE: LDH: 144 U/L (ref 98–192)

## 2021-08-10 NOTE — Progress Notes (Signed)
[facility] OFFICE PROGRESS NOTE  Patient Care Team: [place] as PCP - General Byrnett, Forest Gleason, MD (General Surgery) Cammie Sickle, MD as Consulting Physician (Internal Medicine)   Cancer Staging  No matching staging information was found for the patient.   Oncology History Overview Note  Chief Complaint/Problem List  Breast cancer: T2N0M0, stage II. HER-2/neu 3 +. Estrogen receptor greater than 90%. Progesterone receptor greater than 90%.    Previous therapy:   1. Right mastectomy and axillary lymph node dissection- s/p. Adriamycin and Cytoxan.  3. Tamoxifen 20 mg daily  4. Vaginal bleeding on Tamoxifen in November 2004.   5. Aromasin 25 mg daily starting January 2005. 6. Finished aromosin   was seen in November of 2007, and has refused NSABP B. 42 study  7. Left thigh sarcoma, 2009 [[facility]; s/p Sarcoma spl; Dr.Savage; [facility]; followed by local recurrence s/p RT; Dr.Crystal; no chemo]; last MRI dec 2017- NED   # 2015- Bil Lung nodules [1.3-1.7cm; CXR- April 2018; Dr.Savage]  -----------------------------------------------------  S/p resection ([facility]) July 2009. 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 Focal margin positivity.Three benign appearing lymph nodes.  Surveillance plan: MRI q 6 month through 2014, then annually.  DIAGNOSIS: Extraskeletal myxoid chondrosarcoma /lung nodules   STAGE:   4      ;GOALS: Control  CURRENT/MOST RECENT THERAPY: Surveillance    Malignant neoplasm of skeletal system (HCC)  Carcinoma of overlapping sites of right breast in female, estrogen receptor positive ([place])      INTERVAL HISTORY: Danielle Hansen 71 y.o.  female pleasant patient above history of sarcoma/lung nodules; also history of breast cancer is here for complaints of right chest wall nodule. She has history of right mastectomy. Approximately 1 week ago she noticed pain in right  axilla/right upper chest. Felt like a sharp muscle grabbing like pain. On exam, she noticed a swelling and lump in the area. She has noticed post surgical changes previously but says this was new. Denies worsening shortness of breath. No fevers, chills, night sweats. She continues Votrient/pazopanib for myxoid chrondrosarcoma which is managed by Dr. Kendall Flack at [facility].   Review of Systems  Constitutional:  Negative for chills, diaphoresis, fever, malaise/fatigue and weight loss.  HENT:  Negative for nosebleeds and sore throat.   Eyes:  Negative for double vision.  Respiratory:  Negative for cough, hemoptysis, sputum production, shortness of breath and wheezing.   Cardiovascular:  Negative for chest pain, palpitations, orthopnea and leg swelling.  Gastrointestinal:  Negative for abdominal pain, blood in stool, constipation, diarrhea, heartburn, melena, nausea and vomiting.  Genitourinary:  Negative for dysuria, frequency and urgency.  Musculoskeletal:  Negative for back pain, joint pain and myalgias.  Skin: Negative.  Negative for itching and rash.  Neurological:  Negative for dizziness, tingling, focal weakness, weakness and headaches.  Endo/Heme/Allergies:  Does not bruise/bleed easily.  Psychiatric/Behavioral:  Negative for depression. The patient is not nervous/anxious and does not have insomnia.     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 Dwyane Luo office per notes    BUNIONECTOMY Right 2017   COLONOSCOPY  2014   Verdie Shire, MD   COLONOSCOPY WITH PROPOFOL N/A 02/01/2018   Procedure: COLONOSCOPY WITH PROPOFOL;  Surgeon: Toledo, Benay Pike, MD;  Location: [facility] END[facility]COPY;  Service: Gastroenterology;  Laterality: N/A;   FRACTURE SURGERY Right 12/2016   [facility]   HALLUX VALGUS LAPIDUS Right 05/08/2015   Procedure: LAPIPLASTY 1ST METATARSAL GREAT TOE;  Surgeon: Albertine Patricia, DPM;  Location: [place];  Service: Podiatry;  Laterality: Right;   KNEE ARTHROSCOPY Right 05/03   MASTECTOMY Right 09/09/00   09/09/00-Dr Pat Patrick mastectomy/chemo   OOPHORECTOMY Right 1990   right tube and ovary removed   SALPINGECTOMY Right 1990   SOFT TISSUE TUMOR RESECTION Left 7/09, 6/11   gluteal    FAMILY HISTORY :   Family History  Problem Relation Age of Onset   Breast cancer Sister 57       2004, 2011   Heart disease Sister    Diabetes Father    Hypertension Father    Heart disease Brother    Uterine cancer Sister 56   Diabetes Sister    Ovarian cancer Neg Hx    Colon cancer Neg Hx     SOCIAL HISTORY:   Social History   Tobacco Use   Smoking status: Never   Smokeless tobacco: Never  Vaping Use   Vaping Use: Never used  Substance Use Topics   Alcohol use: No    Alcohol/week: 0.0 standard drinks of alcohol   Drug use: No    ALLERGIES:  has No Known Allergies.  MEDICATIONS:  Current Outpatient Medications  Medication Sig Dispense Refill   acetaminophen (TYLENOL) 325 MG tablet Take 650 mg by mouth every 6 (six) hours as needed.     Calcium Carbonate-Vitamin D (CALCIUM + D PO) Take 1 tablet by mouth daily. CALCITRATE/VITAMIN D 315-200 MG-UNIT TABSGeneric: CALCIUM CITRATE-VITAMIN Dtwo by mouth every morning and one by mouth every evening      cyclobenzaprine (FLEXERIL) 5 MG tablet Take 5 mg by  mouth 3 (three) times daily as needed.      hydrochlorothiazide (HYDRODIURIL) 25 MG tablet 25 mg.     levothyroxine (SYNTHROID) 100 MCG tablet      metFORMIN (GLUCOPHAGE) 500 MG tablet Take 500 mg by mouth daily.      ondansetron (ZOFRAN) 4 MG tablet Take 4 mg by mouth every 8 (eight) hours as needed.     simvastatin (ZOCOR) 20 MG tablet Take 20 mg by mouth daily.      vitamin B-12 (CYANOCOBALAMIN) 1000 MCG tablet Take 1 tablet by mouth daily.     VOTRIENT 200 MG tablet Take by mouth.     ferrous sulfate 325 (65 FE) MG EC tablet Take 325 mg by mouth 3 (three) times daily with meals. (Patient not taking: Reported on 08/10/2021)     No current facility-administered medications for this visit.    PHYSICAL EXAMINATION: ECOG PERFORMANCE STATUS: 0 - Asymptomatic  Ht 5' 9"  (1.753 m)   Wt 259 lb 6.4 oz (117.7 kg)   BMI 38.31 kg/m   [employer]   08/10/21  1524  Weight: 259 lb 6.4 oz (117.7 kg)   Physical Exam Constitutional:      Appearance: She is not ill-appearing.  Eyes:     General: No scleral icterus. Cardiovascular:     Rate and Rhythm: Normal rate and regular rhythm.  Chest:  Breasts:    Right: Absent.       Comments: 1. 5 cm x 3 cm nodularity palpated. Firm. Fixed. Nontender. Linear bruising on skin.  Abdominal:     General: There is no distension.     Palpations: Abdomen is soft.     Tenderness: There is no abdominal tenderness. There is no guarding.  Musculoskeletal:        General: No deformity.     Right lower leg: No edema.     Left lower leg: No edema.  Lymphadenopathy:     Cervical: No cervical adenopathy.  Skin:    General: Skin is warm and dry.  Neurological:     Mental Status: She is alert and oriented to person, place, and time. Mental status is at baseline.  Psychiatric:        Mood and Affect: Mood normal.        Behavior: Behavior normal.     LABORATORY DATA:  I have reviewed the data as listed  Lab Results  Component Value Date   WBC 6.9  08/10/2021   NEUTROABS 3.3 07/21/2021   HGB 14.3 08/10/2021   HCT 41.5 08/10/2021   MCV 87.7 08/10/2021   PLT 201 08/10/2021      Chemistry      Component Value Date/Time   NA 138 08/10/2021 1452   NA 137 08/03/2013 0838   K 3.5 08/10/2021 1452   K 3.4 (L) 08/03/2013 0838   CL 99 08/10/2021 1452   CL 98 08/03/2013 0838   CO2 31 08/10/2021 1452   CO2 32 08/03/2013 0838   BUN 17 08/10/2021 1452   BUN 12 08/03/2013 0838   CREATININE 0.85 08/10/2021 1452   CREATININE 0.90 08/03/2013 0838      Component Value Date/Time   CALCIUM 9.4 08/10/2021 1452   CALCIUM 9.5 08/03/2013 0838   ALKPHOS 88 08/10/2021 1452   ALKPHOS 115 08/03/2013 0838   AST 42 (H) 08/10/2021 1452   AST 18 08/03/2013 0838   ALT 66 (H) 08/10/2021 1452   ALT 23 08/03/2013 0838   BILITOT 0.7 08/10/2021 1452   BILITOT 0.4 08/03/2013 0838       RADIOGRAPHIC STUDIES: I have personally reviewed the radiological images as listed and agreed with the findings in the report. No results found.   ASSESSMENT & PLAN:  No problem-specific Assessment & Plan notes found for this encounter.  Malignant neoplasm of skeletal system [facility])  # Right axillary/chest pain & nodule- CT from May 2023 shows right pectoralis lesion measuring 3.2 x 3.1, previously seen on September 2022 imaging measuring 1.6 x 1.4. No axillary adenopathy. I'm unable to review images and unclear if this lesion corresponds with patient's nodularity & pain. Given mild progression of chrondrosarcoma, concern for metastatic disease vs recurrent breast vs other. Recommend review of images and possible biopsy. Patient prefers to have performed at [facility] and will reach out to Dr. Kendall Flack to discuss. I provided my contact info as well if coordination of care is needed.   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 Kendall Flack. Unable to review images but report reviewed and provided  to patient:  Compared to CT 11/20/2020, most of the pulmonary nodules are unchanged. A few demonstrating interval increase in size and conspicuity for example index 1.5 cm nodule in the right upper lobe, previously measures 12 mm (3/69). Additional index pleural-based mass in left upper lobe measures 3.9 x 4.0 cm, previously measures 3.6 x 3.2 cm.   # Triple positive right breast cancer [2004]- stage II. Given right mastectomy she does not need screening of right breast. Will continue screening mammogram of left breast. Plan for left mammogram in August 2023.   # DISPOSITION:  # Mammogram 3D screen left in August 2023 Follow up with Dr. Rogue Bussing or myself for results- la  No orders of the defined types were placed in this encounter.  All questions were answered. The patient knows to call the clinic with any problems, questions or concerns.    Verlon Au, NP 08/10/2021

## 2021-08-11 ENCOUNTER — Ambulatory Visit: Payer: Medicare Other | Admitting: Oncology

## 2021-08-11 ENCOUNTER — Telehealth: Payer: Self-pay | Admitting: *Deleted

## 2021-08-11 ENCOUNTER — Other Ambulatory Visit
Admission: RE | Admit: 2021-08-11 | Discharge: 2021-08-11 | Disposition: A | Discharge status: Home / Self Care | Payer: Medicare Other | Attending: Internal Medicine | Admitting: Internal Medicine

## 2021-08-11 DIAGNOSIS — C499 Malignant neoplasm of connective and soft tissue, unspecified: Secondary | ICD-10-CM | POA: Insufficient documentation

## 2021-08-11 LAB — COMPREHENSIVE METABOLIC PANEL
ALT: 64 U/L — ABNORMAL HIGH (ref 0–44)
AST: 43 U/L — ABNORMAL HIGH (ref 15–41)
Albumin: 4 g/dL (ref 3.5–5.0)
Alkaline Phosphatase: 86 U/L (ref 38–126)
Anion gap: 10 (ref 5–15)
BUN: 15 mg/dL (ref 8–23)
CO2: 29 mmol/L (ref 22–32)
Calcium: 9.8 mg/dL (ref 8.9–10.3)
Chloride: 97 mmol/L — ABNORMAL LOW (ref 98–111)
Creatinine, Ser: 0.79 mg/dL (ref 0.44–1.00)
GFR, Estimated: >60 mL/min (ref >60)
Glucose, Bld: 127 mg/dL — ABNORMAL HIGH (ref 70–99)
Potassium: 4.3 mmol/L (ref 3.5–5.1)
Sodium: 136 mmol/L (ref 135–145)
Total Bilirubin: 1.2 mg/dL (ref 0.3–1.2)
Total Protein: 7.4 g/dL (ref 6.5–8.1)

## 2021-08-11 LAB — CBC WITH DIFFERENTIAL/PLATELET
Abs Immature Granulocytes: 0.02 10*3/uL (ref 0.00–0.07)
Basophils Absolute: 0 10*3/uL (ref 0.0–0.1)
Basophils Relative: 0 %
Eosinophils Absolute: 0 10*3/uL (ref 0.0–0.5)
Eosinophils Relative: 1 %
HCT: 41.7 % (ref 36.0–46.0)
Hemoglobin: 14.2 g/dL (ref 12.0–15.0)
Immature Granulocytes: 0 %
Lymphocytes Relative: 20 %
Lymphs Abs: 1.1 10*3/uL (ref 0.7–4.0)
MCH: 29.5 pg (ref 26.0–34.0)
MCHC: 34.1 g/dL (ref 30.0–36.0)
MCV: 86.7 fL (ref 80.0–100.0)
Monocytes Absolute: 0.3 10*3/uL (ref 0.1–1.0)
Monocytes Relative: 6 %
Neutro Abs: 4.1 10*3/uL (ref 1.7–7.7)
Neutrophils Relative %: 73 %
Platelets: 210 10*3/uL (ref 150–400)
RBC: 4.81 MIL/uL (ref 3.87–5.11)
RDW: 15.5 % (ref 11.5–15.5)
WBC: 5.6 10*3/uL (ref 4.0–10.5)
nRBC: 0 % (ref 0.0–0.2)

## 2021-08-11 LAB — LACTATE DEHYDROGENASE: LDH: 153 U/L (ref 98–192)

## 2021-08-11 NOTE — Telephone Encounter (Signed)
Per Beckey Rutter, NP's request.  I have faxed her last note to Dr. Alesia Morin office at [facility] at [phone].

## 2021-08-19 ENCOUNTER — Other Ambulatory Visit: Payer: Medicare Other

## 2021-08-19 ENCOUNTER — Ambulatory Visit: Payer: Medicare Other | Admitting: Internal Medicine

## 2021-09-14 ENCOUNTER — Other Ambulatory Visit: Payer: Medicare Other

## 2021-09-14 ENCOUNTER — Ambulatory Visit: Payer: Medicare Other | Admitting: Internal Medicine

## 2021-09-15 ENCOUNTER — Other Ambulatory Visit
Admission: RE | Admit: 2021-09-15 | Discharge: 2021-09-15 | Disposition: A | Discharge status: Home / Self Care | Payer: Medicare Other | Attending: Internal Medicine | Admitting: Internal Medicine

## 2021-09-15 DIAGNOSIS — C499 Malignant neoplasm of connective and soft tissue, unspecified: Secondary | ICD-10-CM | POA: Insufficient documentation

## 2021-09-15 LAB — CBC WITH DIFFERENTIAL/PLATELET
Abs Immature Granulocytes: 0.02 10*3/uL (ref 0.00–0.07)
Basophils Absolute: 0 10*3/uL (ref 0.0–0.1)
Basophils Relative: 0 %
Eosinophils Absolute: 0 10*3/uL (ref 0.0–0.5)
Eosinophils Relative: 1 %
HCT: 39 % (ref 36.0–46.0)
Hemoglobin: 13.4 g/dL (ref 12.0–15.0)
Immature Granulocytes: 0 %
Lymphocytes Relative: 25 %
Lymphs Abs: 1.2 10*3/uL (ref 0.7–4.0)
MCH: 30.7 pg (ref 26.0–34.0)
MCHC: 34.4 g/dL (ref 30.0–36.0)
MCV: 89.2 fL (ref 80.0–100.0)
Monocytes Absolute: 0.3 10*3/uL (ref 0.1–1.0)
Monocytes Relative: 5 %
Neutro Abs: 3.3 10*3/uL (ref 1.7–7.7)
Neutrophils Relative %: 69 %
Platelets: 200 10*3/uL (ref 150–400)
RBC: 4.37 MIL/uL (ref 3.87–5.11)
RDW: 14.9 % (ref 11.5–15.5)
WBC: 4.8 10*3/uL (ref 4.0–10.5)
nRBC: 0 % (ref 0.0–0.2)

## 2021-09-15 LAB — COMPREHENSIVE METABOLIC PANEL
ALT: 28 U/L (ref 0–44)
AST: 27 U/L (ref 15–41)
Albumin: 4 g/dL (ref 3.5–5.0)
Alkaline Phosphatase: 67 U/L (ref 38–126)
Anion gap: 5 (ref 5–15)
BUN: 18 mg/dL (ref 8–23)
CO2: 31 mmol/L (ref 22–32)
Calcium: 9.6 mg/dL (ref 8.9–10.3)
Chloride: 103 mmol/L (ref 98–111)
Creatinine, Ser: 1.05 mg/dL — ABNORMAL HIGH (ref 0.44–1.00)
GFR, Estimated: 58 mL/min — ABNORMAL LOW (ref >60)
Glucose, Bld: 119 mg/dL — ABNORMAL HIGH (ref 70–99)
Potassium: 3.8 mmol/L (ref 3.5–5.1)
Sodium: 139 mmol/L (ref 135–145)
Total Bilirubin: 0.9 mg/dL (ref 0.3–1.2)
Total Protein: 7.1 g/dL (ref 6.5–8.1)

## 2021-09-15 LAB — LACTATE DEHYDROGENASE: LDH: 158 U/L (ref 98–192)

## 2021-10-07 ENCOUNTER — Other Ambulatory Visit
Admission: RE | Admit: 2021-10-07 | Discharge: 2021-10-07 | Disposition: A | Discharge status: Home / Self Care | Payer: Medicare Other | Attending: Internal Medicine | Admitting: Internal Medicine

## 2021-10-07 DIAGNOSIS — C499 Malignant neoplasm of connective and soft tissue, unspecified: Secondary | ICD-10-CM | POA: Diagnosis present

## 2021-10-07 LAB — COMPREHENSIVE METABOLIC PANEL
ALT: 23 U/L (ref 0–44)
AST: 24 U/L (ref 15–41)
Albumin: 4.1 g/dL (ref 3.5–5.0)
Alkaline Phosphatase: 65 U/L (ref 38–126)
Anion gap: 8 (ref 5–15)
BUN: 16 mg/dL (ref 8–23)
CO2: 28 mmol/L (ref 22–32)
Calcium: 10 mg/dL (ref 8.9–10.3)
Chloride: 102 mmol/L (ref 98–111)
Creatinine, Ser: 0.9 mg/dL (ref 0.44–1.00)
GFR, Estimated: >60 mL/min (ref >60)
Glucose, Bld: 118 mg/dL — ABNORMAL HIGH (ref 70–99)
Potassium: 4 mmol/L (ref 3.5–5.1)
Sodium: 138 mmol/L (ref 135–145)
Total Bilirubin: 0.9 mg/dL (ref 0.3–1.2)
Total Protein: 7.3 g/dL (ref 6.5–8.1)

## 2021-10-07 LAB — CBC WITH DIFFERENTIAL/PLATELET
Abs Immature Granulocytes: 0.02 10*3/uL (ref 0.00–0.07)
Basophils Absolute: 0 10*3/uL (ref 0.0–0.1)
Basophils Relative: 0 %
Eosinophils Absolute: 0 10*3/uL (ref 0.0–0.5)
Eosinophils Relative: 0 %
HCT: 41.3 % (ref 36.0–46.0)
Hemoglobin: 14.3 g/dL (ref 12.0–15.0)
Immature Granulocytes: 0 %
Lymphocytes Relative: 23 %
Lymphs Abs: 1.2 10*3/uL (ref 0.7–4.0)
MCH: 31.4 pg (ref 26.0–34.0)
MCHC: 34.6 g/dL (ref 30.0–36.0)
MCV: 90.8 fL (ref 80.0–100.0)
Monocytes Absolute: 0.4 10*3/uL (ref 0.1–1.0)
Monocytes Relative: 7 %
Neutro Abs: 3.6 10*3/uL (ref 1.7–7.7)
Neutrophils Relative %: 70 %
Platelets: 210 10*3/uL (ref 150–400)
RBC: 4.55 MIL/uL (ref 3.87–5.11)
RDW: 13.6 % (ref 11.5–15.5)
WBC: 5.2 10*3/uL (ref 4.0–10.5)
nRBC: 0 % (ref 0.0–0.2)

## 2021-10-07 LAB — LACTATE DEHYDROGENASE: LDH: 144 U/L (ref 98–192)

## 2021-10-12 ENCOUNTER — Other Ambulatory Visit
Admission: RE | Admit: 2021-10-12 | Discharge: 2021-10-12 | Disposition: A | Discharge status: Home / Self Care | Payer: Medicare Other | Source: Ambulatory Visit | Attending: Nurse Practitioner | Admitting: Nurse Practitioner

## 2021-10-12 ENCOUNTER — Ambulatory Visit
Admission: RE | Admit: 2021-10-12 | Discharge: 2021-10-12 | Disposition: A | Discharge status: Home / Self Care | Payer: Medicare Other | Source: Ambulatory Visit | Attending: Nurse Practitioner | Admitting: Nurse Practitioner

## 2021-10-12 DIAGNOSIS — Z1231 Encounter for screening mammogram for malignant neoplasm of breast: Secondary | ICD-10-CM | POA: Diagnosis present

## 2021-10-12 DIAGNOSIS — R922 Inconclusive mammogram: Secondary | ICD-10-CM | POA: Diagnosis not present

## 2021-10-12 LAB — CBC WITH DIFFERENTIAL/PLATELET
Abs Immature Granulocytes: 0.03 10*3/uL (ref 0.00–0.07)
Basophils Absolute: 0 10*3/uL (ref 0.0–0.1)
Basophils Relative: 0 %
Eosinophils Absolute: 0 10*3/uL (ref 0.0–0.5)
Eosinophils Relative: 0 %
HCT: 41.6 % (ref 36.0–46.0)
Hemoglobin: 14.3 g/dL (ref 12.0–15.0)
Immature Granulocytes: 1 %
Lymphocytes Relative: 18 %
Lymphs Abs: 1 10*3/uL (ref 0.7–4.0)
MCH: 31.4 pg (ref 26.0–34.0)
MCHC: 34.4 g/dL (ref 30.0–36.0)
MCV: 91.4 fL (ref 80.0–100.0)
Monocytes Absolute: 0.3 10*3/uL (ref 0.1–1.0)
Monocytes Relative: 5 %
Neutro Abs: 4.2 10*3/uL (ref 1.7–7.7)
Neutrophils Relative %: 76 %
Platelets: 218 10*3/uL (ref 150–400)
RBC: 4.55 MIL/uL (ref 3.87–5.11)
RDW: 13.2 % (ref 11.5–15.5)
WBC: 5.5 10*3/uL (ref 4.0–10.5)
nRBC: 0 % (ref 0.0–0.2)

## 2021-10-12 LAB — COMPREHENSIVE METABOLIC PANEL
ALT: 21 U/L (ref 0–44)
AST: 23 U/L (ref 15–41)
Albumin: 4.2 g/dL (ref 3.5–5.0)
Alkaline Phosphatase: 70 U/L (ref 38–126)
Anion gap: 8 (ref 5–15)
BUN: 18 mg/dL (ref 8–23)
CO2: 27 mmol/L (ref 22–32)
Calcium: 9.6 mg/dL (ref 8.9–10.3)
Chloride: 101 mmol/L (ref 98–111)
Creatinine, Ser: 0.86 mg/dL (ref 0.44–1.00)
GFR, Estimated: >60 mL/min (ref >60)
Glucose, Bld: 113 mg/dL — ABNORMAL HIGH (ref 70–99)
Potassium: 3.6 mmol/L (ref 3.5–5.1)
Sodium: 136 mmol/L (ref 135–145)
Total Bilirubin: 0.7 mg/dL (ref 0.3–1.2)
Total Protein: 7.4 g/dL (ref 6.5–8.1)

## 2021-10-12 LAB — LACTATE DEHYDROGENASE: LDH: 150 U/L (ref 98–192)

## 2021-10-15 ENCOUNTER — Ambulatory Visit: Payer: Medicare Other | Admitting: Oncology

## 2021-10-16 ENCOUNTER — Inpatient Hospital Stay: Payer: Medicare Other | Attending: Nurse Practitioner | Admitting: Internal Medicine

## 2021-10-16 ENCOUNTER — Encounter: Payer: Self-pay | Admitting: Internal Medicine

## 2021-10-16 DIAGNOSIS — E039 Hypothyroidism, unspecified: Secondary | ICD-10-CM | POA: Diagnosis not present

## 2021-10-16 DIAGNOSIS — Z9011 Acquired absence of right breast and nipple: Secondary | ICD-10-CM | POA: Diagnosis not present

## 2021-10-16 DIAGNOSIS — Z853 Personal history of malignant neoplasm of breast: Secondary | ICD-10-CM | POA: Diagnosis not present

## 2021-10-16 DIAGNOSIS — Z1231 Encounter for screening mammogram for malignant neoplasm of breast: Secondary | ICD-10-CM

## 2021-10-16 DIAGNOSIS — C7801 Secondary malignant neoplasm of right lung: Secondary | ICD-10-CM | POA: Insufficient documentation

## 2021-10-16 DIAGNOSIS — C7802 Secondary malignant neoplasm of left lung: Secondary | ICD-10-CM | POA: Diagnosis not present

## 2021-10-16 DIAGNOSIS — E119 Type 2 diabetes mellitus without complications: Secondary | ICD-10-CM | POA: Diagnosis not present

## 2021-10-16 DIAGNOSIS — Z803 Family history of malignant neoplasm of breast: Secondary | ICD-10-CM | POA: Diagnosis not present

## 2021-10-16 DIAGNOSIS — I10 Essential (primary) hypertension: Secondary | ICD-10-CM | POA: Diagnosis not present

## 2021-10-16 DIAGNOSIS — C419 Malignant neoplasm of bone and articular cartilage, unspecified: Secondary | ICD-10-CM

## 2021-10-16 DIAGNOSIS — Z808 Family history of malignant neoplasm of other organs or systems: Secondary | ICD-10-CM | POA: Insufficient documentation

## 2021-10-16 NOTE — Assessment & Plan Note (Addendum)
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 She has a long convoluted history starting with resection in 2009 and 2011 with Dr. Leonides Schanz of a left buttock extraskeletal myxoid chondrosarcoma. She underwent postoperative radiation had no evidence of recurrent disease for 10 years. She is followed with Dr. Kendall Flack and has been on pazopanib. She has noticed an enlarging right axillary mass as well as lesions on her scalp. She recently underwent excision by dermatologist of her scalp masses which unfortunately pathology tissue exam came back consistent with metastatic extraskeletal myxoid chondrosarcoma. Has noted enlarging right axillary mass over the past several months. No right upper extremity symptoms. She denies numbness tingling or pain. She also has a history of right stage II breast cancer status post mastectomy and chemotherapy in 2003.    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  Breast exam within normal limits.  STABLE. Ordered mammogram from August 2022.   #Diabetes well controlled-on oral hypoglycemic agents.  Stable  # Hypothyroidism- on synthroid.  # DISPOSITION:  # Mammogram left screening in second week of august-  # Follow-up-12 months-  MD/labs-cbc/cmp/ldh- Dr.B

## 2021-10-16 NOTE — Progress Notes (Unsigned)
[place] OFFICE PROGRESS NOTE  Patient Care Team: [place] as PCP - General Byrnett, Forest Gleason, MD (General Surgery) Cammie Sickle, MD as Consulting Physician (Internal Medicine)   Cancer Staging  No matching staging information was found for the patient.   Oncology History Overview Note  Chief Complaint/Problem List  Breast cancer: T2N0M0, stage II. HER-2/neu 3 +. Estrogen receptor greater than 90%. Progesterone receptor greater than 90%.    Previous therapy:   1. Right mastectomy and axillary lymph node dissection- s/p. Adriamycin and Cytoxan.  3. Tamoxifen 20 mg daily  4. Vaginal bleeding on Tamoxifen in November 2004.   5. Aromasin 25 mg daily starting January 2005. 6. Finished aromosin   was seen in November of 2007, and has refused NSABP B. 42 study  7. Left thigh sarcoma, 2009 [[facility]; s/p Sarcoma spl; Dr.Savage; [facility]; followed by local recurrence s/p RT; Dr.Crystal; no chemo]; last MRI dec 2017- NED   # 2015- Bil Lung nodules [1.3-1.7cm; CXR- April 2018; Dr.Savage]  -----------------------------------------------------  S/p resection ([facility]) July 2009. 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 Focal margin positivity.Three benign appearing lymph nodes.  Surveillance plan: MRI q 6 month through 2014, then annually.  DIAGNOSIS: Extraskeletal myxoid chondrosarcoma /lung nodules   STAGE:   4      ;GOALS: Control  CURRENT/MOST RECENT THERAPY: Surveillance    Malignant neoplasm of skeletal system (HCC)  Carcinoma of overlapping sites of right breast in female, estrogen receptor positive ([place])      INTERVAL HISTORY:  Danielle Hansen 71 y.o.  female pleasant patient above history of sarcoma/lung nodules; also history of breast cancer is here for follow-up.  presenting with new complaint of right axillary/pectoralis mass. She has a long convoluted history starting with  resection in 2009 and 2011 with Dr. Leonides Schanz of a left buttock extraskeletal myxoid chondrosarcoma. She underwent postoperative radiation had no evidence of recurrent disease for 10 years. She is followed with Dr. Kendall Flack and has been on pazopanib. She has noticed an enlarging right axillary mass as well as lesions on her scalp. She recently underwent excision by dermatologist of her scalp masses which unfortunately pathology tissue exam came back consistent with metastatic extraskeletal myxoid chondrosarcoma. Has noted enlarging right axillary mass over the past several months. No right upper extremity symptoms. She denies numbness tingling or pain. She also has a history of right stage II breast cancer status post mastectomy and chemotherapy in 2003.   Patient denies any worsening shortness of breath or cough.  Denies any nausea vomiting.  Denies any worsening back pain or bone pain.  She continues to have chronic joint arthritis.  Unfortunately patient has gained weight because of dietary indiscretion.  Denies any abdominal distention or swelling in the legs.     Review of Systems  Constitutional:  Negative for chills, diaphoresis, fever, malaise/fatigue and weight loss.  HENT:  Negative for nosebleeds and sore throat.   Eyes:  Negative for double vision.  Respiratory:  Negative for cough, hemoptysis, sputum production, shortness of breath and wheezing.   Cardiovascular:  Negative for chest pain, palpitations, orthopnea and leg swelling.  Gastrointestinal:  Negative for abdominal pain, blood in stool, constipation, diarrhea, heartburn, melena, nausea and vomiting.  Genitourinary:  Negative for dysuria, frequency and urgency.  Musculoskeletal:  Positive for joint pain. Negative for back pain.  Skin: Negative.  Negative for itching and rash.  Neurological:  Negative for dizziness, tingling, focal weakness, weakness and  headaches.  Endo/Heme/Allergies:  Does not bruise/bleed easily.   Psychiatric/Behavioral:  Negative for depression. The patient is not nervous/anxious and does not have insomnia.      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 Dwyane Luo office per notes    BUNIONECTOMY Right 2017   COLONOSCOPY  2014   Verdie Shire, MD   COLONOSCOPY WITH PROPOFOL N/A 02/01/2018   Procedure: COLONOSCOPY WITH PROPOFOL;  Surgeon: Toledo, Benay Pike, MD;  Location: [facility];  Service: Gastroenterology;  Laterality: N/A;   FRACTURE SURGERY Right 12/2016   [facility]   HALLUX VALGUS LAPIDUS Right 05/08/2015   Procedure: LAPIPLASTY 1ST METATARSAL GREAT TOE;  Surgeon: Albertine Patricia, DPM;  Location: [place];  Service: Podiatry;  Laterality: Right;   KNEE ARTHROSCOPY Right 05/03   MASTECTOMY Right 09/09/00   09/09/00-Dr Pat Patrick mastectomy/chemo   OOPHORECTOMY Right 1990   right tube and ovary removed   SALPINGECTOMY Right 1990   SOFT TISSUE TUMOR RESECTION Left 7/09, 6/11   gluteal    FAMILY HISTORY :   Family History  Problem Relation Age of Onset   Breast cancer Sister 57       2004, 2011   Heart disease Sister    Diabetes Father    Hypertension Father    Heart disease Brother    Uterine cancer Sister 70   Diabetes Sister    Ovarian cancer Neg Hx    Colon cancer Neg Hx     SOCIAL  HISTORY:   Social History   Tobacco Use   Smoking status: Never   Smokeless tobacco: Never  Vaping Use   Vaping Use: Never used  Substance Use Topics   Alcohol use: No    Alcohol/week: 0.0 standard drinks of alcohol   Drug use: No    ALLERGIES:  has No Known Allergies.  MEDICATIONS:  Current Outpatient Medications  Medication Sig Dispense Refill   acetaminophen (TYLENOL) 325 MG tablet Take 650 mg by mouth every 6 (six) hours as needed.     Calcium Carbonate-Vitamin D (CALCIUM + D PO) Take 1 tablet by mouth daily. CALCITRATE/VITAMIN D 315-200 MG-UNIT TABSGeneric: CALCIUM CITRATE-VITAMIN Dtwo by mouth every morning and one by mouth every evening      cyclobenzaprine (FLEXERIL) 5 MG tablet Take 5 mg by mouth 3 (three) times daily as needed.      ferrous sulfate 325 (65 FE) MG EC tablet Take 325 mg by mouth 3 (three) times daily with meals. (Patient not taking: Reported on 08/10/2021)     hydrochlorothiazide (HYDRODIURIL) 25 MG tablet 25 mg.     levothyroxine (SYNTHROID) 100 MCG tablet      metFORMIN (GLUCOPHAGE) 500 MG tablet Take 500 mg by  mouth daily.      ondansetron (ZOFRAN) 4 MG tablet Take 4 mg by mouth every 8 (eight) hours as needed.     simvastatin (ZOCOR) 20 MG tablet Take 20 mg by mouth daily.      vitamin B-12 (CYANOCOBALAMIN) 1000 MCG tablet Take 1 tablet by mouth daily.     VOTRIENT 200 MG tablet Take by mouth.     No current facility-administered medications for this visit.    PHYSICAL EXAMINATION: ECOG PERFORMANCE STATUS: 0 - Asymptomatic  There were no vitals taken for this visit.  There were no vitals filed for this visit.  Physical Exam HENT:     Head: Normocephalic and atraumatic.     Mouth/Throat:     Pharynx: No oropharyngeal exudate.  Eyes:     Pupils: Pupils are equal, round, and reactive to light.  Cardiovascular:     Rate and Rhythm: Normal rate and regular rhythm.  Pulmonary:     Effort: Pulmonary effort is normal. No respiratory distress.      Breath sounds: Normal breath sounds. No wheezing.  Abdominal:     General: Bowel sounds are normal. There is no distension.     Palpations: Abdomen is soft. There is no mass.     Tenderness: There is no abdominal tenderness. There is no guarding or rebound.  Musculoskeletal:        General: No tenderness. Normal range of motion.     Cervical back: Normal range of motion and neck supple.  Skin:    General: Skin is warm.     Comments: Right -mastectomy; no evidence of any subcutaneous masses or skin rash.  LEFT BREAST exam [in the presence of nurse]- no unusual skin changes or dominant masses felt. Surgical scars noted.    Neurological:     Mental Status: She is alert and oriented to person, place, and time.  Psychiatric:        Mood and Affect: Affect normal.     LABORATORY DATA:  I have reviewed the data as listed    Component Value Date/Time   NA 136 10/12/2021 1112   NA 137 08/03/2013 0838   K 3.6 10/12/2021 1112   K 3.4 (L) 08/03/2013 0838   CL 101 10/12/2021 1112   CL 98 08/03/2013 0838   CO2 27 10/12/2021 1112   CO2 32 08/03/2013 0838   GLUCOSE 113 (H) 10/12/2021 1112   GLUCOSE 121 (H) 08/03/2013 0838   BUN 18 10/12/2021 1112   BUN 12 08/03/2013 0838   CREATININE 0.86 10/12/2021 1112   CREATININE 0.90 08/03/2013 0838   CALCIUM 9.6 10/12/2021 1112   CALCIUM 9.5 08/03/2013 0838   PROT 7.4 10/12/2021 1112   PROT 7.9 08/03/2013 0838   ALBUMIN 4.2 10/12/2021 1112   ALBUMIN 3.8 08/03/2013 0838   AST 23 10/12/2021 1112   AST 18 08/03/2013 0838   ALT 21 10/12/2021 1112   ALT 23 08/03/2013 0838   ALKPHOS 70 10/12/2021 1112   ALKPHOS 115 08/03/2013 0838   BILITOT 0.7 10/12/2021 1112   BILITOT 0.4 08/03/2013 0838   GFRNONAA >60 10/12/2021 1112   GFRNONAA >60 08/03/2013 0838   GFRAA >60 09/13/2019 0907   GFRAA >60 08/03/2013 0838    No results found for: "SPEP", "UPEP"  Lab Results  Component Value Date   WBC 5.5 10/12/2021   NEUTROABS 4.2 10/12/2021   HGB  14.3 10/12/2021   HCT 41.6 10/12/2021   MCV 91.4 10/12/2021   PLT 218 10/12/2021  Chemistry      Component Value Date/Time   NA 136 10/12/2021 1112   NA 137 08/03/2013 0838   K 3.6 10/12/2021 1112   K 3.4 (L) 08/03/2013 0838   CL 101 10/12/2021 1112   CL 98 08/03/2013 0838   CO2 27 10/12/2021 1112   CO2 32 08/03/2013 0838   BUN 18 10/12/2021 1112   BUN 12 08/03/2013 0838   CREATININE 0.86 10/12/2021 1112   CREATININE 0.90 08/03/2013 0838      Component Value Date/Time   CALCIUM 9.6 10/12/2021 1112   CALCIUM 9.5 08/03/2013 0838   ALKPHOS 70 10/12/2021 1112   ALKPHOS 115 08/03/2013 0838   AST 23 10/12/2021 1112   AST 18 08/03/2013 0838   ALT 21 10/12/2021 1112   ALT 23 08/03/2013 0838   BILITOT 0.7 10/12/2021 1112   BILITOT 0.4 08/03/2013 0838       RADIOGRAPHIC STUDIES: I have personally reviewed the radiological images as listed and agreed with the findings in the report. No results found.   ASSESSMENT & PLAN:  No problem-specific Assessment & Plan notes found for this encounter.   No orders of the defined types were placed in this encounter.  All questions were answered. The patient knows to call the clinic with any problems, questions or concerns.      Cammie Sickle, MD 10/16/2021 12:49 PM

## 2021-10-16 NOTE — Progress Notes (Unsigned)
Patient denies new problems/concerns today.   °

## 2021-10-19 ENCOUNTER — Other Ambulatory Visit
Admission: RE | Admit: 2021-10-19 | Discharge: 2021-10-19 | Disposition: A | Discharge status: Home / Self Care | Payer: Medicare Other | Attending: Internal Medicine | Admitting: Internal Medicine

## 2021-10-19 DIAGNOSIS — C499 Malignant neoplasm of connective and soft tissue, unspecified: Secondary | ICD-10-CM | POA: Insufficient documentation

## 2021-10-19 LAB — CBC WITH DIFFERENTIAL/PLATELET
Abs Immature Granulocytes: 0.01 10*3/uL (ref 0.00–0.07)
Basophils Absolute: 0 10*3/uL (ref 0.0–0.1)
Basophils Relative: 0 %
Eosinophils Absolute: 0 10*3/uL (ref 0.0–0.5)
Eosinophils Relative: 1 %
HCT: 41.2 % (ref 36.0–46.0)
Hemoglobin: 14.1 g/dL (ref 12.0–15.0)
Immature Granulocytes: 0 %
Lymphocytes Relative: 27 %
Lymphs Abs: 1.3 10*3/uL (ref 0.7–4.0)
MCH: 31.5 pg (ref 26.0–34.0)
MCHC: 34.2 g/dL (ref 30.0–36.0)
MCV: 92.2 fL (ref 80.0–100.0)
Monocytes Absolute: 0.3 10*3/uL (ref 0.1–1.0)
Monocytes Relative: 7 %
Neutro Abs: 3.2 10*3/uL (ref 1.7–7.7)
Neutrophils Relative %: 65 %
Platelets: 198 10*3/uL (ref 150–400)
RBC: 4.47 MIL/uL (ref 3.87–5.11)
RDW: 13.3 % (ref 11.5–15.5)
WBC: 4.9 10*3/uL (ref 4.0–10.5)
nRBC: 0 % (ref 0.0–0.2)

## 2021-10-19 LAB — COMPREHENSIVE METABOLIC PANEL
ALT: 18 U/L (ref 0–44)
AST: 23 U/L (ref 15–41)
Albumin: 4.1 g/dL (ref 3.5–5.0)
Alkaline Phosphatase: 63 U/L (ref 38–126)
Anion gap: 8 (ref 5–15)
BUN: 15 mg/dL (ref 8–23)
CO2: 26 mmol/L (ref 22–32)
Calcium: 9.7 mg/dL (ref 8.9–10.3)
Chloride: 102 mmol/L (ref 98–111)
Creatinine, Ser: 0.86 mg/dL (ref 0.44–1.00)
GFR, Estimated: >60 mL/min (ref >60)
Glucose, Bld: 141 mg/dL — ABNORMAL HIGH (ref 70–99)
Potassium: 3.7 mmol/L (ref 3.5–5.1)
Sodium: 136 mmol/L (ref 135–145)
Total Bilirubin: 0.9 mg/dL (ref 0.3–1.2)
Total Protein: 7.3 g/dL (ref 6.5–8.1)

## 2021-10-19 LAB — LACTATE DEHYDROGENASE: LDH: 129 U/L (ref 98–192)

## 2021-10-26 ENCOUNTER — Other Ambulatory Visit
Admission: RE | Admit: 2021-10-26 | Discharge: 2021-10-26 | Disposition: A | Discharge status: Home / Self Care | Payer: Medicare Other | Attending: Internal Medicine | Admitting: Internal Medicine

## 2021-10-26 DIAGNOSIS — C499 Malignant neoplasm of connective and soft tissue, unspecified: Secondary | ICD-10-CM | POA: Insufficient documentation

## 2021-10-26 LAB — COMPREHENSIVE METABOLIC PANEL
ALT: 22 U/L (ref 0–44)
AST: 25 U/L (ref 15–41)
Albumin: 4 g/dL (ref 3.5–5.0)
Alkaline Phosphatase: 64 U/L (ref 38–126)
Anion gap: 10 (ref 5–15)
BUN: 16 mg/dL (ref 8–23)
CO2: 24 mmol/L (ref 22–32)
Calcium: 9.6 mg/dL (ref 8.9–10.3)
Chloride: 100 mmol/L (ref 98–111)
Creatinine, Ser: 0.81 mg/dL (ref 0.44–1.00)
GFR, Estimated: >60 mL/min (ref >60)
Glucose, Bld: 120 mg/dL — ABNORMAL HIGH (ref 70–99)
Potassium: 3.7 mmol/L (ref 3.5–5.1)
Sodium: 134 mmol/L — ABNORMAL LOW (ref 135–145)
Total Bilirubin: 1 mg/dL (ref 0.3–1.2)
Total Protein: 7.1 g/dL (ref 6.5–8.1)

## 2021-10-26 LAB — CBC WITH DIFFERENTIAL/PLATELET
Abs Immature Granulocytes: 0.01 10*3/uL (ref 0.00–0.07)
Basophils Absolute: 0 10*3/uL (ref 0.0–0.1)
Basophils Relative: 0 %
Eosinophils Absolute: 0 10*3/uL (ref 0.0–0.5)
Eosinophils Relative: 1 %
HCT: 40.8 % (ref 36.0–46.0)
Hemoglobin: 14.3 g/dL (ref 12.0–15.0)
Immature Granulocytes: 0 %
Lymphocytes Relative: 25 %
Lymphs Abs: 1.2 10*3/uL (ref 0.7–4.0)
MCH: 31.9 pg (ref 26.0–34.0)
MCHC: 35 g/dL (ref 30.0–36.0)
MCV: 91.1 fL (ref 80.0–100.0)
Monocytes Absolute: 0.3 10*3/uL (ref 0.1–1.0)
Monocytes Relative: 6 %
Neutro Abs: 3.2 10*3/uL (ref 1.7–7.7)
Neutrophils Relative %: 68 %
Platelets: 225 10*3/uL (ref 150–400)
RBC: 4.48 MIL/uL (ref 3.87–5.11)
RDW: 12.9 % (ref 11.5–15.5)
WBC: 4.7 10*3/uL (ref 4.0–10.5)
nRBC: 0 % (ref 0.0–0.2)

## 2021-10-26 LAB — LACTATE DEHYDROGENASE: LDH: 139 U/L (ref 98–192)

## 2021-11-09 ENCOUNTER — Other Ambulatory Visit
Admission: RE | Admit: 2021-11-09 | Discharge: 2021-11-09 | Disposition: A | Discharge status: Home / Self Care | Payer: Medicare Other | Attending: Internal Medicine | Admitting: Internal Medicine

## 2021-11-09 DIAGNOSIS — C499 Malignant neoplasm of connective and soft tissue, unspecified: Secondary | ICD-10-CM | POA: Insufficient documentation

## 2021-11-09 LAB — COMPREHENSIVE METABOLIC PANEL
ALT: 18 U/L (ref 0–44)
AST: 23 U/L (ref 15–41)
Albumin: 4 g/dL (ref 3.5–5.0)
Alkaline Phosphatase: 69 U/L (ref 38–126)
Anion gap: 10 (ref 5–15)
BUN: 17 mg/dL (ref 8–23)
CO2: 26 mmol/L (ref 22–32)
Calcium: 9.5 mg/dL (ref 8.9–10.3)
Chloride: 103 mmol/L (ref 98–111)
Creatinine, Ser: 0.85 mg/dL (ref 0.44–1.00)
GFR, Estimated: >60 mL/min (ref >60)
Glucose, Bld: 160 mg/dL — ABNORMAL HIGH (ref 70–99)
Potassium: 3.7 mmol/L (ref 3.5–5.1)
Sodium: 139 mmol/L (ref 135–145)
Total Bilirubin: 0.8 mg/dL (ref 0.3–1.2)
Total Protein: 7 g/dL (ref 6.5–8.1)

## 2021-11-09 LAB — CBC WITH DIFFERENTIAL/PLATELET
Abs Immature Granulocytes: 0.02 10*3/uL (ref 0.00–0.07)
Basophils Absolute: 0 10*3/uL (ref 0.0–0.1)
Basophils Relative: 0 %
Eosinophils Absolute: 0.1 10*3/uL (ref 0.0–0.5)
Eosinophils Relative: 1 %
HCT: 38.8 % (ref 36.0–46.0)
Hemoglobin: 13.3 g/dL (ref 12.0–15.0)
Immature Granulocytes: 1 %
Lymphocytes Relative: 29 %
Lymphs Abs: 1.2 10*3/uL (ref 0.7–4.0)
MCH: 31.7 pg (ref 26.0–34.0)
MCHC: 34.3 g/dL (ref 30.0–36.0)
MCV: 92.6 fL (ref 80.0–100.0)
Monocytes Absolute: 0.3 10*3/uL (ref 0.1–1.0)
Monocytes Relative: 6 %
Neutro Abs: 2.6 10*3/uL (ref 1.7–7.7)
Neutrophils Relative %: 63 %
Platelets: 215 10*3/uL (ref 150–400)
RBC: 4.19 MIL/uL (ref 3.87–5.11)
RDW: 12.6 % (ref 11.5–15.5)
WBC: 4.1 10*3/uL (ref 4.0–10.5)
nRBC: 0 % (ref 0.0–0.2)

## 2021-11-09 LAB — LACTATE DEHYDROGENASE: LDH: 144 U/L (ref 98–192)

## 2022-03-18 HISTORY — PX: OTHER SURGICAL HISTORY: SHX169

## 2022-07-22 ENCOUNTER — Ambulatory Visit (INDEPENDENT_AMBULATORY_CARE_PROVIDER_SITE_OTHER): Payer: Medicare Other | Admitting: Advanced Practice Midwife

## 2022-07-22 ENCOUNTER — Encounter: Payer: Self-pay | Admitting: Advanced Practice Midwife

## 2022-07-22 VITALS — BP 120/81 | HR 100 | Ht 67.5 in | Wt 268.2 lb

## 2022-07-22 DIAGNOSIS — Z01419 Encounter for gynecological examination (general) (routine) without abnormal findings: Secondary | ICD-10-CM | POA: Diagnosis not present

## 2022-07-22 DIAGNOSIS — Z0000 Encounter for general adult medical examination without abnormal findings: Secondary | ICD-10-CM

## 2022-07-25 ENCOUNTER — Encounter: Payer: Self-pay | Admitting: Advanced Practice Midwife

## 2022-07-25 NOTE — Progress Notes (Signed)
[facility]  Date of Service: 07/22/2022  Gynecology Annual Exam  PCP: [facility]  Chief Complaint:  Chief Complaint  Patient presents with   Annual Exam    History of Present Illness:Patient is a 72 y.o. G1P1001 presents for annual exam. The patient has no gyn complaints today. She is on long term chemo drug for Sarcoma and mentions side effects she has; fatigue, diarrhea, headache, hypertension, hot flashes.  LMP: No LMP recorded. Patient is postmenopausal.   The patient is not sexually active. She denies dyspareunia.  The patient does perform self breast exams.  There is no notable family history of breast or ovarian cancer in her family. The patient and her sister have both had breast cancer- sister is BRCA negative. Patient has history of right mastectomy.  The patient wears seatbelts: yes.   The patient has regular exercise:  she does water aerobics regularly. She reports diet is good- she has decreased her salt intake. She admits adequate hydration and 6-7 hours of sleep .    The patient denies current symptoms of depression.     Review of Systems: Review of Systems  Constitutional:  Positive for malaise/fatigue. Negative for chills and fever.  HENT:  Negative for congestion, ear discharge, ear pain, hearing loss, sinus pain and sore throat.   Eyes:  Negative for blurred vision and double vision.  Respiratory:  Negative for cough, shortness of breath and wheezing.   Cardiovascular:  Negative for chest pain, palpitations and leg swelling.  Gastrointestinal:  Positive for diarrhea. Negative for abdominal pain, blood in stool, constipation, heartburn, melena, nausea and vomiting.  Genitourinary:  Negative for dysuria, flank pain, frequency, hematuria and urgency.  Musculoskeletal:  Negative for back pain, joint pain and myalgias.  Skin:  Negative for itching and rash.  Neurological:  Positive for headaches. Negative for dizziness, tingling, tremors, sensory change,  speech change, focal weakness, seizures, loss of consciousness and weakness.  Endo/Heme/Allergies:  Negative for environmental allergies. Does not bruise/bleed easily.       Positive for hot flashes  Psychiatric/Behavioral:  Negative for depression, hallucinations, memory loss, substance abuse and suicidal ideas. The patient is not nervous/anxious and does not have insomnia.     Past Medical History:  Patient Active Problem List   Diagnosis Date Noted   History of obstructive sleep apnea 09/12/2019    Formatting of this note might be different from the original. improved with weight loss    Closed fracture of lower end of right radius with routine healing 01/09/2018   History of fracture of radius 01/11/2017    Formatting of this note might be different from the original. Distal right radius 01/08/2017, S/P ORIF    Carcinoma of overlapping sites of right breast in female, estrogen receptor positive (HCC) 08/24/2016   B12 deficiency 11/22/2015    Formatting of this note might be different from the original. B12 level 234 on 11/20/2015; started on oral supplement.    Severe obesity (BMI >= 40) (HCC) 11/20/2015    Last Assessment & Plan: Formatting of this note might be different from the original. >>ASSESSMENT AND PLAN FOR OBESITY (BMI 30-39.9), UNSPECIFIED WRITTEN ON 06/12/2022  7:14 PM BY COWARD, SHANNON HIATT, NP Stable  - Encouraging pt to work on low fat/low cholesterol/ low carb diet and to increase exercise as able.    Breast cyst 09/03/2015   Other long term (current) drug therapy 11/20/2014   Cervical radiculitis 08/15/2014   Muscle spasms of neck 08/15/2014  Chemical diabetes 05/23/2014    Overview:  A1c 6.4% 11/19/2013    Type 2 diabetes mellitus with other specified complication (HCC) 05/23/2014    Formatting of this note might be different from the original. HgbA1c reached 6.5% 11/19/2016    DDD (degenerative disc disease), lumbar 11/18/2013   Benign essential HTN  10/03/2013   Acid reflux 10/03/2013   Big thyroid 10/03/2013    Overview:  Dr. Dario Guardian    Hypercholesterolemia 10/03/2013    Overview:  Managed by Dr. Darrold Junker    Hyperlipidemia associated with type 2 diabetes mellitus (HCC) 10/03/2013   Hypertension associated with type 2 diabetes mellitus (HCC) 10/03/2013   Lung nodule, solitary 01/11/2012    Overview:  Overview:  7 mm RLL nodule, stable since 2009.  Surveillance plan: alternating low dose chest CT and CXR q6 months through 2014, then switch to CXR only.    Malignant neoplasm of skeletal system (HCC) 07/06/2011    Overview:  Overview:  S/p resection (Ward) July 2009. 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 Focal margin positivity.Three benign appearing lymph nodes.  Surveillance plan: MRI q 6 month through 2014, then annually.    Myxoid chondrosarcoma (HCC) 07/06/2011    Formatting of this note might be different from the original. S/p resection (Ward) July 2009. 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 Focal margin positivity.Three benign appearing lymph nodes.  Surveillance plan: MRI q 6 month through 2014, then annually.    H/O malignant neoplasm of breast 08/29/2000    Overview:  right, S/P MRM, chemo (Dr. Doylene Canning)     Past Surgical History:  Past Surgical History:  Procedure Laterality Date   BREAST CYST ASPIRATION Left 09/30/2016   FNA done at Dr. Rutherford Nail office per notes    BUNIONECTOMY Right 2017   COLONOSCOPY  2014   Lutricia Feil, MD   COLONOSCOPY WITH PROPOFOL N/A 02/01/2018   Procedure: COLONOSCOPY WITH PROPOFOL;  Surgeon: Toledo, Boykin Nearing, MD;  Location: [facility];  Service: Gastroenterology;  Laterality: N/A;   EXTRASKELETAL MIXOID CRONDOSARCOMA Right 03/18/2022   FRACTURE SURGERY Right 12/2016   [facility]   HALLUX VALGUS LAPIDUS Right 05/08/2015   Procedure: LAPIPLASTY 1ST METATARSAL GREAT TOE;  Surgeon: Recardo Evangelist, DPM;   Location: [facility];  Service: Podiatry;  Laterality: Right;   KNEE ARTHROSCOPY Right 06/2001   MASTECTOMY Right 09/09/2000   09/09/00-Dr Michela Pitcher mastectomy/chemo   OOPHORECTOMY Right 1990   right tube and ovary removed   SALPINGECTOMY Right 1990   SOFT TISSUE TUMOR RESECTION Left 7/09, 6/11   gluteal    Gynecologic History:  No LMP recorded. Patient is postmenopausal. Last Pap: 2022 Results were:  no abnormalities  Last mammogram: 10/12/21 Results were: BI-RAD I  Obstetric History: G1P1001  Family History:  Family History  Problem Relation Age of Onset   Breast cancer Sister 30       2004, 2011   Heart disease Sister    Diabetes Father    Hypertension Father    Heart disease Brother    Uterine cancer Sister 83   Diabetes Sister    Ovarian cancer Neg Hx    Colon cancer Neg Hx     Social History:  Social History   Socioeconomic History   Marital status: Married    Spouse name: Not on file   Number of children: Not on file   Years of education: Not on file   Highest education level: Not on file  Occupational History  Not on file  Tobacco Use   Smoking status: Never   Smokeless tobacco: Never  Vaping Use   Vaping Use: Never used  Substance and Sexual Activity   Alcohol use: No    Alcohol/week: 0.0 standard drinks of alcohol   Drug use: No   Sexual activity: Yes    Birth control/protection: None  Other Topics Concern   Not on file  Social History Narrative   Not on file   Social Determinants of Health   Financial Resource Strain: Not on file  Food Insecurity: Not on file  Transportation Needs: Not on file  Physical Activity: Not on file  Stress: Not on file  Social Connections: Not on file  Intimate Partner Violence: Not on file    Allergies:  No Known Allergies  Medications: Prior to Admission medications   Medication Sig Start Date End Date Taking? Authorizing Provider  acetaminophen (TYLENOL) 325 MG tablet Take 650 mg by mouth every 6  (six) hours as needed.   Yes [provider]  Calcium Carbonate-Vitamin D (CALCIUM + D PO) Take 1 tablet by mouth daily. CALCITRATE/VITAMIN D 315-200 MG-UNIT TABSGeneric: CALCIUM CITRATE-VITAMIN Dtwo by mouth every morning and one by mouth every evening  07/31/09  Yes [provider]  cyclobenzaprine (FLEXERIL) 5 MG tablet Take 5 mg by mouth 3 (three) times daily as needed.    Yes [provider]  hydrochlorothiazide (HYDRODIURIL) 25 MG tablet 25 mg. 01/26/13  Yes [provider]  levothyroxine (SYNTHROID) 100 MCG tablet  05/29/21  Yes [provider]  lisinopril (ZESTRIL) 5 MG tablet  06/27/22  Yes [provider]  metFORMIN (GLUCOPHAGE) 500 MG tablet Take 500 mg by mouth daily.  05/24/17 09/05/26 Yes [provider]  ondansetron (ZOFRAN) 4 MG tablet Take 4 mg by mouth every 8 (eight) hours as needed. 05/08/21  Yes [provider]  simvastatin (ZOCOR) 20 MG tablet Take 20 mg by mouth daily.  05/14/08  Yes [provider]  vitamin B-12 (CYANOCOBALAMIN) 1000 MCG tablet Take 1 tablet by mouth daily.   Yes [provider]  VOTRIENT 200 MG tablet Take 800 mg by mouth. 07/17/21  Yes [provider]    Physical Exam Vitals: Blood pressure 120/81, pulse 100, height 5' 7.5" (1.715 m), weight 268 lb 3.2 oz (121.7 kg).  General: NAD HEENT: normocephalic, anicteric Thyroid: no enlargement, no palpable nodules Pulmonary: No increased work of breathing, CTAB Cardiovascular: RRR, distal pulses 2+ Breast: right breast surgically absent, no tenderness, no palpable nodules or masses, no skin or nipple retraction present, no nipple discharge.  No axillary or supraclavicular lymphadenopathy. Abdomen: NABS, soft, non-tender, non-distended.  Umbilicus without lesions.  No hepatomegaly, splenomegaly or masses palpable. No evidence of hernia  Genitourinary: deferred for no concerns Extremities: no edema, erythema, or  tenderness Neurologic: Grossly intact Psychiatric: mood appropriate, affect full    Assessment: 72 y.o. G1P1001 routine annual exam  Plan: Problem List Items Addressed This Visit   None Visit Diagnoses     Well woman exam without gynecological exam    -  Primary       1) Mammogram - recommend yearly screening mammogram.  Mammogram Was ordered today  2) STI screening  was not offered and therefore not obtained  3) ASCCP guidelines and rationale discussed.  Patient opts for discontinue age >69 screening interval  4) Osteoporosis  - per USPTF routine screening DEXA at age 68 Normal screening with low risk in 2014  Consider FDA-approved medical  therapies in postmenopausal women and men aged 90 years and older, based on the following: a) A hip or vertebral (clinical or morphometric) fracture b) T-score ? -2.5 at the femoral neck or spine after appropriate evaluation to exclude secondary causes C) Low bone mass (T-score between -1.0 and -2.5 at the femoral neck or spine) and a 10-year probability of a hip fracture ? 3% or a 10-year probability of a major osteoporosis-related fracture ? 20% based on the [place]-adapted WHO algorithm   5) Routine healthcare maintenance including cholesterol, diabetes screening discussed managed by PCP  6) Colonoscopy due in 2029.  Screening recommended starting at age 83 for average risk individuals, age 74 for individuals deemed at increased risk (including African Americans) and recommended to continue until age 22.  For patient age 38-85 individualized approach is recommended.  Gold standard screening is via colonoscopy, Cologuard screening is an acceptable alternative for patient unwilling or unable to undergo colonoscopy.  "Colorectal cancer screening for average?risk adults: 2018 guideline update from the American Cancer Society"CA: A Cancer Journal for Clinicians: Jul 28, 2016   7) Return in about 2 years (around 07/21/2024) for annual established  gyn.    Tresea Mall, CNM [facility] 07/25/2022 2:04 PM

## 2022-10-14 ENCOUNTER — Ambulatory Visit
Admission: RE | Admit: 2022-10-14 | Discharge: 2022-10-14 | Disposition: A | Discharge status: Home / Self Care | Payer: Medicare Other | Source: Ambulatory Visit | Attending: Internal Medicine | Admitting: Internal Medicine

## 2022-10-14 DIAGNOSIS — C419 Malignant neoplasm of bone and articular cartilage, unspecified: Secondary | ICD-10-CM | POA: Diagnosis not present

## 2022-10-14 DIAGNOSIS — Z1231 Encounter for screening mammogram for malignant neoplasm of breast: Secondary | ICD-10-CM | POA: Insufficient documentation

## 2022-10-19 ENCOUNTER — Ambulatory Visit: Payer: Medicare Other | Admitting: Oncology

## 2022-10-19 ENCOUNTER — Other Ambulatory Visit: Payer: Self-pay

## 2022-10-19 ENCOUNTER — Encounter: Payer: Self-pay | Admitting: Nurse Practitioner

## 2022-10-19 ENCOUNTER — Other Ambulatory Visit: Payer: Medicare Other

## 2022-10-19 ENCOUNTER — Inpatient Hospital Stay: Payer: Medicare Other

## 2022-10-19 ENCOUNTER — Inpatient Hospital Stay: Payer: Medicare Other | Attending: Internal Medicine | Admitting: Nurse Practitioner

## 2022-10-19 VITALS — BP 120/88 | HR 80 | Temp 97.4°F | Ht 67.5 in | Wt 269.0 lb

## 2022-10-19 DIAGNOSIS — E119 Type 2 diabetes mellitus without complications: Secondary | ICD-10-CM | POA: Diagnosis not present

## 2022-10-19 DIAGNOSIS — C419 Malignant neoplasm of bone and articular cartilage, unspecified: Secondary | ICD-10-CM | POA: Diagnosis present

## 2022-10-19 DIAGNOSIS — Z853 Personal history of malignant neoplasm of breast: Secondary | ICD-10-CM | POA: Diagnosis not present

## 2022-10-19 DIAGNOSIS — Z9011 Acquired absence of right breast and nipple: Secondary | ICD-10-CM | POA: Insufficient documentation

## 2022-10-19 DIAGNOSIS — E039 Hypothyroidism, unspecified: Secondary | ICD-10-CM | POA: Diagnosis not present

## 2022-10-19 DIAGNOSIS — Z8049 Family history of malignant neoplasm of other genital organs: Secondary | ICD-10-CM | POA: Diagnosis not present

## 2022-10-19 DIAGNOSIS — Z08 Encounter for follow-up examination after completed treatment for malignant neoplasm: Secondary | ICD-10-CM

## 2022-10-19 DIAGNOSIS — R918 Other nonspecific abnormal finding of lung field: Secondary | ICD-10-CM | POA: Diagnosis not present

## 2022-10-19 DIAGNOSIS — Z803 Family history of malignant neoplasm of breast: Secondary | ICD-10-CM | POA: Diagnosis not present

## 2022-10-19 DIAGNOSIS — Z7984 Long term (current) use of oral hypoglycemic drugs: Secondary | ICD-10-CM | POA: Insufficient documentation

## 2022-10-19 LAB — COMPREHENSIVE METABOLIC PANEL
ALT: 14 U/L (ref 0–44)
AST: 16 U/L (ref 15–41)
Albumin: 3.9 g/dL (ref 3.5–5.0)
Alkaline Phosphatase: 56 U/L (ref 38–126)
Anion gap: 9 (ref 5–15)
BUN: 14 mg/dL (ref 8–23)
CO2: 28 mmol/L (ref 22–32)
Calcium: 9.5 mg/dL (ref 8.9–10.3)
Chloride: 96 mmol/L — ABNORMAL LOW (ref 98–111)
Creatinine, Ser: 0.8 mg/dL (ref 0.44–1.00)
GFR, Estimated: >60 mL/min (ref >60)
Glucose, Bld: 114 mg/dL — ABNORMAL HIGH (ref 70–99)
Potassium: 3.8 mmol/L (ref 3.5–5.1)
Sodium: 133 mmol/L — ABNORMAL LOW (ref 135–145)
Total Bilirubin: 0.6 mg/dL (ref 0.3–1.2)
Total Protein: 7 g/dL (ref 6.5–8.1)

## 2022-10-19 LAB — CBC WITH DIFFERENTIAL/PLATELET
Abs Immature Granulocytes: 0.02 10*3/uL (ref 0.00–0.07)
Basophils Absolute: 0 10*3/uL (ref 0.0–0.1)
Basophils Relative: 0 %
Eosinophils Absolute: 0 10*3/uL (ref 0.0–0.5)
Eosinophils Relative: 0 %
HCT: 38.5 % (ref 36.0–46.0)
Hemoglobin: 12.7 g/dL (ref 12.0–15.0)
Immature Granulocytes: 0 %
Lymphocytes Relative: 28 %
Lymphs Abs: 1.4 10*3/uL (ref 0.7–4.0)
MCH: 31.3 pg (ref 26.0–34.0)
MCHC: 33 g/dL (ref 30.0–36.0)
MCV: 94.8 fL (ref 80.0–100.0)
Monocytes Absolute: 0.3 10*3/uL (ref 0.1–1.0)
Monocytes Relative: 6 %
Neutro Abs: 3.2 10*3/uL (ref 1.7–7.7)
Neutrophils Relative %: 66 %
Platelets: 242 10*3/uL (ref 150–400)
RBC: 4.06 MIL/uL (ref 3.87–5.11)
RDW: 13.9 % (ref 11.5–15.5)
WBC: 4.9 10*3/uL (ref 4.0–10.5)
nRBC: 0 % (ref 0.0–0.2)

## 2022-10-19 LAB — LACTATE DEHYDROGENASE: LDH: 122 U/L (ref 98–192)

## 2022-10-19 NOTE — Progress Notes (Signed)
[facility] OFFICE PROGRESS NOTE  Patient Care Team: [facility] as PCP - General Byrnett, Merrily Pew, MD (General Surgery) Earna Coder, MD as Consulting Physician (Internal Medicine)   Cancer Staging  No matching staging information was found for the patient.  Oncology History Overview Note  Chief Complaint/Problem List  Breast cancer: T2N0M0, stage II. HER-2/neu 3 +. Estrogen receptor greater than 90%. Progesterone receptor greater than 90%.    Previous therapy:   1. Right mastectomy and axillary lymph node dissection- s/p. Adriamycin and Cytoxan.  3. Tamoxifen 20 mg daily  4. Vaginal bleeding on Tamoxifen in November 2004.   5. Aromasin 25 mg daily starting January 2005. 6. Finished aromosin   was seen in November of 2007, and has refused NSABP B. 42 study  7. Left thigh sarcoma, 2009 [[facility]; s/p Sarcoma spl; Dr.Savage; [facility]; followed by local recurrence s/p RT; Dr.Crystal; no chemo]; last MRI dec 2017- [facility]   # 2015- Bil Lung nodules [1.3-1.7cm; CXR- April 2018; Dr.Savage]  -----------------------------------------------------  S/p resection (Ward) July 2009. 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 Focal margin positivity.Three benign appearing lymph nodes.  Surveillance plan: MRI q 6 month through 2014, then annually.  DIAGNOSIS: Extraskeletal myxoid chondrosarcoma /lung nodules   STAGE:   4      ;GOALS: Control  CURRENT/MOST RECENT THERAPY: Surveillance    Malignant neoplasm of skeletal system (HCC)  Carcinoma of overlapping sites of right breast in female, estrogen receptor positive (HCC)      INTERVAL HISTORY: Lenny Pastel 72 y.o. female pleasant patient with above history of sarcoma/lung nodules-most recently on pazopanib [Dr. Flonnie Hailstone [facility]]; also history of breast cancer is here for follow-up. No additional recurrences of her chondrosarcoma. Denies breast  lumps, pain, or skin changes. Continues pazopanib. Has fatigue that waxes and wanes at times. Denies unintentional weight loss or new pains.   Review of Systems  Constitutional:  Positive for malaise/fatigue. Negative for chills, diaphoresis, fever and weight loss.  HENT:  Negative for nosebleeds and sore throat.   Eyes:  Negative for double vision.  Respiratory:  Negative for cough, hemoptysis, sputum production, shortness of breath and wheezing.   Cardiovascular:  Negative for chest pain, palpitations, orthopnea and leg swelling.  Gastrointestinal:  Negative for abdominal pain, blood in stool, constipation, diarrhea, heartburn, melena, nausea and vomiting.  Genitourinary:  Negative for dysuria, frequency and urgency.  Musculoskeletal:  Positive for joint pain. Negative for back pain and myalgias.  Skin: Negative.  Negative for itching and rash.  Neurological:  Negative for dizziness, tingling, focal weakness, weakness and headaches.  Endo/Heme/Allergies:  Does not bruise/bleed easily.  Psychiatric/Behavioral:  Negative for depression. The patient is not nervous/anxious and does not have insomnia.      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   FRACTURE SURGERY Right 12/2016   [facility]   HALLUX VALGUS LAPIDUS Right 05/08/2015   Procedure: LAPIPLASTY 1ST METATARSAL GREAT TOE;  Surgeon: Recardo Evangelist, DPM;  Location: [facility];  Service: Podiatry;  Laterality: Right;   KNEE ARTHROSCOPY Right 06/2001   MASTECTOMY Right 09/09/2000   09/09/00-Dr Michela Pitcher mastectomy/chemo   OOPHORECTOMY Right 1990   right tube and ovary removed   SALPINGECTOMY Right 1990   SOFT TISSUE TUMOR RESECTION Left 7/09, 6/11   gluteal    FAMILY HISTORY :   Family History  Problem Relation Age of Onset   Breast cancer Sister 55       2004, 2011   Heart disease Sister    Diabetes Father    Hypertension Father    Heart disease Brother    Uterine cancer Sister 37   Diabetes Sister    Ovarian cancer Neg Hx    Colon cancer Neg Hx     SOCIAL HISTORY:   Social History   Tobacco Use   Smoking status: Never   Smokeless tobacco: Never  Vaping Use   Vaping status: Never Used  Substance Use Topics   Alcohol use: No    Alcohol/week: 0.0 standard drinks of alcohol   Drug use: No    ALLERGIES:  has No Known Allergies.  MEDICATIONS:  Current Outpatient Medications  Medication Sig Dispense Refill   acetaminophen (TYLENOL) 325 MG tablet Take 650 mg by mouth every 6 (six) hours as needed.     Calcium Carbonate-Vitamin D (CALCIUM + D PO) Take 1 tablet by mouth daily. CALCITRATE/VITAMIN D 315-200 MG-UNIT TABSGeneric: CALCIUM CITRATE-VITAMIN Dtwo by mouth every morning and one by mouth every evening      cyclobenzaprine (FLEXERIL) 5 MG tablet Take 5 mg by mouth 3 (three) times daily as needed.      hydrochlorothiazide (HYDRODIURIL) 25 MG tablet 25 mg.     levothyroxine (SYNTHROID) 100 MCG  tablet      lisinopril (ZESTRIL) 5 MG tablet      metFORMIN (GLUCOPHAGE) 500 MG tablet Take 500 mg by mouth daily.      ondansetron (ZOFRAN) 4 MG tablet Take 4 mg by mouth every 8 (eight) hours as needed.     simvastatin (ZOCOR) 20 MG tablet Take 20 mg by mouth daily.      vitamin B-12 (CYANOCOBALAMIN) 1000 MCG tablet Take 1 tablet by mouth daily.     VOTRIENT 200 MG tablet Take 800 mg by mouth.     No current facility-administered medications for this visit.    PHYSICAL EXAMINATION: ECOG PERFORMANCE STATUS: 0 - Asymptomatic  BP 120/88 (BP Location: Left Arm, Patient Position: Sitting)   Pulse 80   Temp (!) 97.4 F (36.3 C) (Tympanic)   Ht 5' 7.5" (1.715 m)   Wt 269 lb (122 kg)   SpO2 100%   BMI 41.51 kg/m   Filed Weights   10/19/22 1519  Weight: 269 lb (122 kg)   Physical Exam Vitals reviewed.  Constitutional:  Appearance: She is not ill-appearing.  HENT:     Head: Normocephalic and atraumatic.  Cardiovascular:     Rate and Rhythm: Normal rate and regular rhythm.  Pulmonary:     Effort: Pulmonary effort is normal. No respiratory distress.     Breath sounds: No wheezing.  Abdominal:     General: There is no distension.     Palpations: Abdomen is soft.     Tenderness: There is no abdominal tenderness. There is no guarding.  Musculoskeletal:        General: No tenderness.  Skin:    General: Skin is warm.     Coloration: Skin is not pale.     Comments: Right -mastectomy; no evidence of any subcutaneous masses or skin rash.  LEFT BREAST exam [in the presence of nurse]- no unusual skin changes or dominant masses felt. Surgical scars noted.    Neurological:     Mental Status: She is alert and oriented to person, place, and time.  Psychiatric:        Mood and Affect: Mood and affect normal.        Behavior: Behavior normal.    LABORATORY DATA:  I have reviewed the data as listed    Component Value Date/Time   NA 133 (L) 10/19/2022 1503   NA 137 08/03/2013  0838   K 3.8 10/19/2022 1503   K 3.4 (L) 08/03/2013 0838   CL 96 (L) 10/19/2022 1503   CL 98 08/03/2013 0838   CO2 28 10/19/2022 1503   CO2 32 08/03/2013 0838   GLUCOSE 114 (H) 10/19/2022 1503   GLUCOSE 121 (H) 08/03/2013 0838   BUN 14 10/19/2022 1503   BUN 12 08/03/2013 0838   CREATININE 0.80 10/19/2022 1503   CREATININE 0.90 08/03/2013 0838   CALCIUM 9.5 10/19/2022 1503   CALCIUM 9.5 08/03/2013 0838   PROT 7.0 10/19/2022 1503   PROT 7.9 08/03/2013 0838   ALBUMIN 3.9 10/19/2022 1503   ALBUMIN 3.8 08/03/2013 0838   AST 16 10/19/2022 1503   AST 18 08/03/2013 0838   ALT 14 10/19/2022 1503   ALT 23 08/03/2013 0838   ALKPHOS 56 10/19/2022 1503   ALKPHOS 115 08/03/2013 0838   BILITOT 0.6 10/19/2022 1503   BILITOT 0.4 08/03/2013 0838   GFRNONAA >60 10/19/2022 1503   GFRNONAA >60 08/03/2013 0838   GFRAA >60 09/13/2019 0907   GFRAA >60 08/03/2013 0838   Lab Results  Component Value Date   WBC 4.9 10/19/2022   NEUTROABS 3.2 10/19/2022   HGB 12.7 10/19/2022   HCT 38.5 10/19/2022   MCV 94.8 10/19/2022   PLT 242 10/19/2022    RADIOGRAPHIC STUDIES: I have personally reviewed the radiological images as listed and agreed with the findings in the report. No results found.   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 Recurrence in 2023 axilla. Patient currently on pazopanib 800mg /day [Dr.Savage, [facility]]. Visit on 08/03/22.  Continue follow-up with Dr. Lin Givens at [facility].   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  Breast exam within normal limits. August 2024- Mammo within normal limits. Stable. Discussed releasing from clinic. She can have mammograms with her pcp and return to the cancer center as needed. She will ocntinue other cancer screenings and wellness with pcp.    #  Diabetes well controlled- on oral hypoglycemic agents. Stable   # Hypothyroidism-  on synthroid. Stable   DISPOSITION:  Follow up with PCP. Return to clinic in the future as needed or if concerning symptoms.   No problem-specific Assessment & Plan notes found for this encounter.  Orders Placed This Encounter  Procedures   CBC with Differential/Platelet    Standing Status:   Future    Standing Expiration Date:   10/19/2023   Comprehensive metabolic panel    Standing Status:   Future    Standing Expiration Date:   10/19/2023   Lactate dehydrogenase    Standing Status:   Future    Standing Expiration Date:   10/19/2023   All questions were answered. The patient knows to call the clinic with any problems, questions or concerns.    Alinda Dooms, NP 10/19/2022

## 2023-06-09 ENCOUNTER — Other Ambulatory Visit: Payer: Self-pay | Admitting: Gerontology

## 2023-06-09 DIAGNOSIS — Z1231 Encounter for screening mammogram for malignant neoplasm of breast: Secondary | ICD-10-CM

## 2023-07-14 ENCOUNTER — Encounter: Payer: Self-pay | Admitting: Nurse Practitioner

## 2023-07-14 ENCOUNTER — Inpatient Hospital Stay: Attending: Nurse Practitioner | Admitting: Nurse Practitioner

## 2023-07-14 VITALS — BP 132/87 | HR 82 | Temp 96.6°F | Resp 16 | Wt 262.0 lb

## 2023-07-14 DIAGNOSIS — Z8049 Family history of malignant neoplasm of other genital organs: Secondary | ICD-10-CM | POA: Diagnosis not present

## 2023-07-14 DIAGNOSIS — E039 Hypothyroidism, unspecified: Secondary | ICD-10-CM | POA: Insufficient documentation

## 2023-07-14 DIAGNOSIS — Z9011 Acquired absence of right breast and nipple: Secondary | ICD-10-CM | POA: Insufficient documentation

## 2023-07-14 DIAGNOSIS — Z803 Family history of malignant neoplasm of breast: Secondary | ICD-10-CM | POA: Insufficient documentation

## 2023-07-14 DIAGNOSIS — E119 Type 2 diabetes mellitus without complications: Secondary | ICD-10-CM | POA: Diagnosis not present

## 2023-07-14 DIAGNOSIS — N644 Mastodynia: Secondary | ICD-10-CM | POA: Diagnosis not present

## 2023-07-14 DIAGNOSIS — Z9221 Personal history of antineoplastic chemotherapy: Secondary | ICD-10-CM | POA: Insufficient documentation

## 2023-07-14 DIAGNOSIS — Z85831 Personal history of malignant neoplasm of soft tissue: Secondary | ICD-10-CM | POA: Diagnosis not present

## 2023-07-14 DIAGNOSIS — Z853 Personal history of malignant neoplasm of breast: Secondary | ICD-10-CM | POA: Diagnosis not present

## 2023-07-14 NOTE — Progress Notes (Signed)
 [facility] OFFICE PROGRESS NOTE  Patient Care Team: [facility] as PCP - General Byrnett, Reyes ORN, MD (General Surgery) Rennie Cindy SAUNDERS, MD as Consulting Physician (Internal Medicine)   Cancer Staging  No matching staging information was found for the patient.  Oncology History Overview Note  Chief Complaint/Problem List  Breast cancer: T2N0M0, stage II. HER-2/neu 3 +. Estrogen receptor greater than 90%. Progesterone receptor greater than 90%.    Previous therapy:   1. Right mastectomy and axillary lymph node dissection- s/p. Adriamycin and Cytoxan.  3. Tamoxifen 20 mg daily  4. Vaginal bleeding on Tamoxifen in November 2004.   5. Aromasin 25 mg daily starting January 2005. 6. Finished aromosin   was seen in November of 2007, and has refused NSABP B. 42 study  7. Left thigh sarcoma, 2009 [[facility]; s/p Sarcoma spl; Dr.Savage; [facility]; followed by local recurrence s/p RT; Dr.Crystal; no chemo]; last MRI dec 2017- [facility]   # 2015- Bil Lung nodules [1.3-1.7cm; CXR- April 2018; Dr.Savage]  -----------------------------------------------------  S/p resection ([facility]) July 2009. 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 Focal margin positivity.Three benign appearing lymph nodes.  Surveillance plan: MRI q 6 month through 2014, then annually.  DIAGNOSIS: Extraskeletal myxoid chondrosarcoma /lung nodules   STAGE:   4      ;GOALS: Control  CURRENT/MOST RECENT THERAPY: Surveillance    Malignant neoplasm of skeletal system (HCC)  Carcinoma of overlapping sites of right breast in female, estrogen receptor positive (HCC)   INTERVAL HISTORY: Danielle Hansen 73 y.o. female pleasant patient with above history of sarcoma/lung nodules, recurrence s/p resection and radiation in 2011, on pazopanib with Dr Marci at [facility] for progressive pulmonary metastases since March 2023 and palliative resection of enlarging  right chest wall mass 03/18/22, and history of breast cancer s/p mastectomy and adjuvant chemotherapy in 2003, on surveillance, who returns to clinic for concerns of breast pain. She denies interval recurrences of her chrondrosarcoma. She performs self breast exams and denies new masses but endorses shooting like sensation at times. No skin changes. She has chronic fatigue which comes and goes. Denies new headaches, shortness of breath, unintentional weight loss. No bone pain. She has chronic diarrhea.   Review of Systems  Constitutional:  Positive for malaise/fatigue. Negative for chills, diaphoresis, fever and weight loss.  HENT:  Negative for nosebleeds and sore throat.   Eyes:  Negative for double vision.  Respiratory:  Negative for cough, hemoptysis, sputum production, shortness of breath and wheezing.   Cardiovascular:  Positive for chest pain. Negative for palpitations, orthopnea and leg swelling.  Gastrointestinal:  Positive for diarrhea. Negative for abdominal pain, blood in stool, constipation, heartburn, melena, nausea and vomiting.  Genitourinary:  Negative for dysuria, frequency and urgency.  Musculoskeletal:  Positive for joint pain. Negative for back pain and myalgias.  Skin: Negative.  Negative for itching and rash.  Neurological:  Negative for dizziness, tingling, focal weakness, weakness and headaches.  Endo/Heme/Allergies:  Does not bruise/bleed easily.  Psychiatric/Behavioral:  Negative for depression. The patient is not nervous/anxious and does not have insomnia.    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   FRACTURE SURGERY Right 12/2016   [facility]   HALLUX VALGUS LAPIDUS Right 05/08/2015   Procedure: LAPIPLASTY 1ST METATARSAL GREAT TOE;  Surgeon: Donnice Cory, DPM;  Location: [facility];  Service: Podiatry;  Laterality: Right;   KNEE ARTHROSCOPY Right 06/2001   MASTECTOMY Right 09/09/2000   09/09/00-Dr Lorrene mastectomy/chemo   OOPHORECTOMY Right 1990   right tube and ovary removed   SALPINGECTOMY Right 1990   SOFT TISSUE TUMOR RESECTION Left 7/09, 6/11   gluteal   FAMILY HISTORY :   Family History  Problem Relation Age of Onset   Breast cancer Sister 8       2004, 2011   Heart disease Sister    Diabetes Father    Hypertension Father    Heart disease Brother    Uterine cancer Sister 50   Diabetes Sister    Ovarian cancer Neg Hx    Colon cancer Neg Hx    SOCIAL HISTORY:   Social History   Tobacco Use   Smoking status: Never   Smokeless tobacco: Never  Vaping Use   Vaping status: Never Used  Substance Use Topics   Alcohol use: No    Alcohol/week: 0.0 standard drinks of alcohol   Drug use: No   ALLERGIES:  has no known allergies.  MEDICATIONS:  Current Outpatient Medications  Medication Sig Dispense Refill   acetaminophen  (TYLENOL ) 325 MG tablet Take 650 mg by mouth every 6 (six) hours as needed.     Calcium Carbonate-Vitamin D  (CALCIUM + D PO) Take 1 tablet by mouth daily. CALCITRATE/VITAMIN D 315-200 MG-UNIT TABSGeneric: CALCIUM CITRATE-VITAMIN Dtwo by mouth every morning and one by mouth every evening      cyclobenzaprine (FLEXERIL) 5 MG tablet Take 5 mg by mouth 3 (three) times daily as needed.      ferrous sulfate 325 (65 FE) MG EC tablet Take 325 mg by mouth.     hydrochlorothiazide (HYDRODIURIL) 25 MG tablet 25 mg.     levothyroxine (SYNTHROID) 100 MCG tablet      lisinopril (ZESTRIL) 5 MG tablet      metFORMIN (GLUCOPHAGE) 500 MG tablet Take 500 mg by mouth daily.      ondansetron  (ZOFRAN ) 4 MG tablet Take 4 mg by mouth every 8 (eight) hours as needed.     simvastatin (ZOCOR) 20 MG tablet Take 20 mg by mouth daily.      vitamin B-12 (CYANOCOBALAMIN) 1000 MCG tablet Take 1 tablet by mouth daily.     VOTRIENT 200 MG tablet Take 800 mg by mouth.     No current facility-administered medications for this visit.    PHYSICAL EXAMINATION: ECOG PERFORMANCE STATUS: 1 - Symptomatic but completely  ambulatory  BP 132/87 (BP Location: Left Arm, Patient Position: Sitting, Cuff Size: Large)   Pulse 82   Temp (!) 96.6 F (35.9 C) (Tympanic)   Resp 16   Wt 262 lb (118.8 kg)   SpO2 97%   BMI 40.43 kg/m   Filed Weights   07/14/23 0954  Weight: 262 lb (118.8 kg)   Physical Exam Vitals reviewed.  Constitutional:      Appearance: She is not ill-appearing.  HENT:     Head: Normocephalic.   Cardiovascular:     Rate and Rhythm: Normal rate and regular rhythm.  Pulmonary:     Effort: Pulmonary effort is normal.     Breath sounds: No wheezing.  Chest:  Breasts:    Right: Absent.     Left: No inverted nipple, mass, nipple discharge, skin change or tenderness.     Comments: Right chest- s/p mastectomy. No apparent masses or skin changes.  Left breast - no palpable masses or skin changes notes. Normal appearing nipple w/o discharge. Pain not reproducible. Well healed surgical scars.  Abdominal:     General:  There is no distension.   Musculoskeletal:        General: No tenderness.  Lymphadenopathy:     Upper Body:     Right upper body: No pectoral adenopathy.     Left upper body: No supraclavicular or axillary adenopathy.   Skin:    General: Skin is warm.     Coloration: Skin is not pale.   Neurological:     Mental Status: She is alert and oriented to person, place, and time.   Psychiatric:        Mood and Affect: Mood and affect normal.        Behavior: Behavior normal.    LABORATORY DATA:  I have reviewed the data as listed    Component Value Date/Time   NA 133 (L) 10/19/2022 1503   NA 137 08/03/2013 0838   K 3.8 10/19/2022 1503   K 3.4 (L) 08/03/2013 0838   CL 96 (L) 10/19/2022 1503   CL 98 08/03/2013 0838   CO2 28 10/19/2022 1503   CO2 32 08/03/2013 0838   GLUCOSE 114 (H) 10/19/2022 1503   GLUCOSE 121 (H) 08/03/2013 0838   BUN 14 10/19/2022 1503   BUN 12 08/03/2013 0838   CREATININE 0.80 10/19/2022 1503   CREATININE 0.90 08/03/2013 0838   CALCIUM 9.5 10/19/2022 1503   CALCIUM 9.5 08/03/2013 0838   PROT 7.0 10/19/2022 1503   PROT 7.9 08/03/2013 0838   ALBUMIN 3.9 10/19/2022 1503   ALBUMIN 3.8 08/03/2013 0838   AST 16 10/19/2022 1503   AST 18 08/03/2013 0838   ALT 14 10/19/2022 1503   ALT 23 08/03/2013 0838   ALKPHOS 56 10/19/2022 1503   ALKPHOS 115 08/03/2013 0838   BILITOT 0.6 10/19/2022 1503   BILITOT 0.4 08/03/2013 0838   GFRNONAA >60 10/19/2022 1503   GFRNONAA >60 08/03/2013 0838   GFRAA >60 09/13/2019 0907   GFRAA >60 08/03/2013 0838   Lab Results  Component Value Date   WBC 4.9 10/19/2022   NEUTROABS 3.2 10/19/2022   HGB 12.7 10/19/2022   HCT 38.5 10/19/2022   MCV 94.8 10/19/2022   PLT 242 10/19/2022    RADIOGRAPHIC STUDIES: I have personally reviewed the radiological images as listed and agreed with the findings in the report. No results found.   ASSESSMENT & PLAN:   Left breast pain- benign appearing. No palpable masses. She will repeat  mammogram in August 2025 asplanned. If symptoms worsen in interim, rtc rfor reevaluation.  Triple positive right breast cancer - diagnosed 2004. Stage II. S/p mastectomy. And 4 cycles of adjuvant chemotherapy. Chest exam w/o apparent recurrent disease. She was released from clinic in 2024.  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 Treated with adjuvant XRT. Known multiple bilateral nodular - biopsy inconclusive. Recurrence in 2023. On pazopanib 800 mg/day with Dr Marci, [facility]. She has follow up 08/03/23. Plan for scans prior to that visit.  T2DM- well controlled. On oral hypoglycemic agents. Stable. Follow up with PCP.  Hypothyroidism- on synthroid. Managed by PCP.  Microcytosis- resolved. Follow up with pcp.    DISPOSITION:  Follow up with PCP. Return to clinic in the future as needed or if concerning symptoms.   No problem-specific Assessment & Plan notes found for this encounter.  No orders of the defined types were placed in this encounter.  All questions were answered. The patient knows to call the clinic with any problems, questions or concerns.    Tinnie KANDICE Dawn, NP 07/14/2023

## 2023-10-17 ENCOUNTER — Ambulatory Visit
Admission: RE | Admit: 2023-10-17 | Discharge: 2023-10-17 | Disposition: A | Discharge status: Home / Self Care | Source: Ambulatory Visit | Attending: Gerontology | Admitting: Gerontology

## 2023-10-17 DIAGNOSIS — Z1231 Encounter for screening mammogram for malignant neoplasm of breast: Secondary | ICD-10-CM | POA: Diagnosis present

## 2023-11-09 ENCOUNTER — Encounter: Payer: Self-pay | Admitting: Internal Medicine

## 2023-11-29 ENCOUNTER — Other Ambulatory Visit: Payer: Self-pay

## 2023-11-29 ENCOUNTER — Ambulatory Visit: Admitting: Anesthesiology

## 2023-11-29 ENCOUNTER — Encounter: Payer: Self-pay | Admitting: Internal Medicine

## 2023-11-29 ENCOUNTER — Encounter
Admission: RE | Disposition: A | Discharge status: Home / Self Care | Payer: Self-pay | Source: Home / Self Care | Attending: Internal Medicine

## 2023-11-29 ENCOUNTER — Ambulatory Visit
Admission: RE | Admit: 2023-11-29 | Discharge: 2023-11-29 | Disposition: A | Discharge status: Home / Self Care | Attending: Internal Medicine | Admitting: Internal Medicine

## 2023-11-29 DIAGNOSIS — Z1211 Encounter for screening for malignant neoplasm of colon: Secondary | ICD-10-CM | POA: Insufficient documentation

## 2023-11-29 DIAGNOSIS — K640 First degree hemorrhoids: Secondary | ICD-10-CM | POA: Insufficient documentation

## 2023-11-29 DIAGNOSIS — Z79899 Other long term (current) drug therapy: Secondary | ICD-10-CM | POA: Diagnosis not present

## 2023-11-29 DIAGNOSIS — E039 Hypothyroidism, unspecified: Secondary | ICD-10-CM | POA: Diagnosis not present

## 2023-11-29 DIAGNOSIS — E119 Type 2 diabetes mellitus without complications: Secondary | ICD-10-CM | POA: Insufficient documentation

## 2023-11-29 DIAGNOSIS — Z7984 Long term (current) use of oral hypoglycemic drugs: Secondary | ICD-10-CM | POA: Diagnosis not present

## 2023-11-29 DIAGNOSIS — K219 Gastro-esophageal reflux disease without esophagitis: Secondary | ICD-10-CM | POA: Diagnosis not present

## 2023-11-29 DIAGNOSIS — C414 Malignant neoplasm of pelvic bones, sacrum and coccyx: Secondary | ICD-10-CM | POA: Insufficient documentation

## 2023-11-29 DIAGNOSIS — I10 Essential (primary) hypertension: Secondary | ICD-10-CM | POA: Diagnosis not present

## 2023-11-29 DIAGNOSIS — C785 Secondary malignant neoplasm of large intestine and rectum: Secondary | ICD-10-CM | POA: Insufficient documentation

## 2023-11-29 HISTORY — DX: Malignant neoplasm of connective and soft tissue, unspecified: C49.9

## 2023-11-29 HISTORY — PX: SUBMUCOSAL INJECTION: SHX5543

## 2023-11-29 HISTORY — PX: COLONOSCOPY: SHX5424

## 2023-11-29 LAB — GLUCOSE, CAPILLARY: Glucose-Capillary: 135 mg/dL — ABNORMAL HIGH (ref 70–99)

## 2023-11-29 SURGERY — COLONOSCOPY
Anesthesia: General

## 2023-11-29 MED ORDER — SPOT INK MARKER SYRINGE KIT
PACK | SUBMUCOSAL | Status: DC | PRN
  Administered 2023-11-29: 5 mL via SUBMUCOSAL
  Administered 2023-11-29: 3 mL via SUBMUCOSAL

## 2023-11-29 MED ORDER — PROPOFOL 500 MG/50ML IV EMUL
INTRAVENOUS | Status: DC | PRN
  Administered 2023-11-29: 75 ug/kg/min via INTRAVENOUS

## 2023-11-29 MED ORDER — SODIUM CHLORIDE 0.9 % IV SOLN: INTRAVENOUS | Status: DC

## 2023-11-29 MED ORDER — PROPOFOL 1000 MG/100ML IV EMUL
INTRAVENOUS | Status: AC
  Filled 2023-11-29: qty 100

## 2023-11-29 MED ORDER — DEXMEDETOMIDINE HCL IN NACL 80 MCG/20ML IV SOLN
INTRAVENOUS | Status: AC
  Filled 2023-11-29: qty 20

## 2023-11-29 MED ORDER — PROPOFOL 10 MG/ML IV BOLUS
INTRAVENOUS | Status: DC | PRN
  Administered 2023-11-29: 30 mg via INTRAVENOUS
  Administered 2023-11-29: 50 mg via INTRAVENOUS

## 2023-11-29 MED ORDER — DEXMEDETOMIDINE HCL IN NACL 80 MCG/20ML IV SOLN
INTRAVENOUS | Status: DC | PRN
  Administered 2023-11-29: 8 ug via INTRAVENOUS
  Administered 2023-11-29: 12 ug via INTRAVENOUS

## 2023-11-29 MED ORDER — LIDOCAINE HCL (PF) 2 % IJ SOLN
INTRAMUSCULAR | Status: AC
  Filled 2023-11-29: qty 5

## 2023-11-29 MED ORDER — LIDOCAINE HCL (CARDIAC) PF 100 MG/5ML IV SOSY
PREFILLED_SYRINGE | INTRAVENOUS | Status: DC | PRN
  Administered 2023-11-29: 80 mg via INTRAVENOUS

## 2023-11-29 NOTE — Anesthesia Postprocedure Evaluation (Signed)
 Anesthesia Post Note  Patient: Danielle Hansen  Procedure(s) Performed: COLONOSCOPY  Patient location during evaluation: PACU Anesthesia Type: General Level of consciousness: awake and alert Pain management: pain level controlled Vital Signs Assessment: post-procedure vital signs reviewed and stable Respiratory status: spontaneous breathing, nonlabored ventilation, respiratory function stable and patient connected to nasal cannula oxygen Cardiovascular status: blood pressure returned to baseline and stable Postop Assessment: no apparent nausea or vomiting Anesthetic complications: no   No notable events documented.   Last Vitals:  Vitals:   11/29/23 0853 11/29/23 0903  BP: (!) 122/59 116/71  Pulse: 67 88  Resp: 14 19  Temp: (!) 35.3 C   SpO2: 98% 99%    Last Pain:  Vitals:   11/29/23 0903  TempSrc:   PainSc: 0-No pain                 Lynwood KANDICE Clause

## 2023-11-29 NOTE — Interval H&P Note (Signed)
 History and Physical Interval Note:  11/29/2023 8:22 AM  Danielle Hansen  has presented today for surgery, with the diagnosis of Z86.0101 (ICD-10-CM) - Hx of adenomatous colonic polyps.  The various methods of treatment have been discussed with the patient and family. After consideration of risks, benefits and other options for treatment, the patient has consented to  Procedure(s) with comments: COLONOSCOPY (N/A) - DM as a surgical intervention.  The patient's history has been reviewed, patient examined, no change in status, stable for surgery.  I have reviewed the patient's chart and labs.  Questions were answered to the patient's satisfaction.     [place], Dayzee Trower

## 2023-11-29 NOTE — Transfer of Care (Signed)
 Immediate Anesthesia Transfer of Care Note  Patient: Danielle Hansen  Procedure(s) Performed: COLONOSCOPY  Patient Location: PACU  Anesthesia Type:General  Level of Consciousness: sedated  Airway & Oxygen Therapy: Patient Spontanous Breathing  Post-op Assessment: Report given to RN and Post -op Vital signs reviewed and stable  Post vital signs: Reviewed and stable  Last Vitals:  Vitals Value Taken Time  BP 122/59 11/29/23 08:55  Temp 35.3 C 11/29/23 08:53  Pulse 66 11/29/23 08:56  Resp 22 11/29/23 08:56  SpO2 98 % 11/29/23 08:56  Vitals shown include unfiled device data.  Last Pain:  Vitals:   11/29/23 0853  TempSrc: Temporal  PainSc: Asleep         Complications: No notable events documented.

## 2023-11-29 NOTE — Anesthesia Preprocedure Evaluation (Signed)
 Anesthesia Evaluation  Patient identified by MRN, date of birth, ID band Patient awake    Reviewed: Allergy & Precautions, H&P , NPO status , Patient's Chart, lab work & pertinent test results, reviewed documented beta blocker date and time   Airway Mallampati: II   Neck ROM: full    Dental  (+) Poor Dentition   Pulmonary neg pulmonary ROS   Pulmonary exam normal        Cardiovascular Exercise Tolerance: Poor hypertension, On Medications negative cardio ROS Normal cardiovascular exam Rhythm:regular Rate:Normal     Neuro/Psych  Neuromuscular disease  negative psych ROS   GI/Hepatic Neg liver ROS,GERD  Medicated,,  Endo/Other  diabetes, Well ControlledHypothyroidism    Renal/GU negative Renal ROS  negative genitourinary   Musculoskeletal   Abdominal   Peds  Hematology negative hematology ROS (+)   Anesthesia Other Findings Past Medical History: No date: Arthritis 2002: Breast cancer (HCC)     Comment:  rt mastectomy/chemo 09/09/2000: Cancer (HCC)     Comment:  rt mastectomy-09/09/00 2009/2011: Cancer (HCC)     Comment:  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; Right 12/2016: FRACTURE SURGERY; Right     Comment:  [facility] 05/08/2015: HALLUX VALGUS LAPIDUS; Right     Comment:  Procedure: LAPIPLASTY 1ST METATARSAL GREAT TOE;                Surgeon: Donnice Cory, DPM;  Location: [facility];  Service: Podiatry;  Laterality: Right; 06/2001: KNEE ARTHROSCOPY; Right 09/09/2000: MASTECTOMY; Right     Comment:  09/09/00-Dr Lorrene mastectomy/chemo 1990: OOPHORECTOMY; Right     Comment:  right tube and ovary removed No date: RADICAL RESECTION OF SOFT TISSUE TUMOR CHEST; Right 1990: SALPINGECTOMY; Right 7/09, 6/11: SOFT TISSUE TUMOR RESECTION; Left     Comment:  gluteal BMI    Body Mass Index: 40.88 kg/m     Reproductive/Obstetrics negative OB ROS                              Anesthesia Physical Anesthesia Plan  ASA: 2  Anesthesia Plan: General   Post-op Pain Management:    Induction:   PONV Risk Score and Plan:   Airway Management Planned:   Additional Equipment:   Intra-op Plan:   Post-operative Plan:  Informed Consent: I have reviewed the patients History and Physical, chart, labs and discussed the procedure including the risks, benefits and alternatives for the proposed anesthesia with the patient or authorized representative who has indicated his/her understanding and acceptance.     Dental Advisory Given  Plan Discussed with: CRNA  Anesthesia Plan Comments:         Anesthesia Quick Evaluation

## 2023-11-29 NOTE — H&P (Signed)
 Outpatient short stay form Pre-procedure 11/29/2023 8:20 AM Danielle Hansen K. Aundria, M.D.  Primary Physician: Danielle Jericho, MD   Reason for visit:  Personal hx of adenomatous polyps  History of present illness:  [name] presents to the [facility] GI clinic to discuss arranging surveillance colonoscopy. She had last colonoscopy performed by Dr. Aundria Dec 2019 which removed one subcentimeter TA. A 5-year repeat was advised. 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 Recent visit last month with Oncology showed stable disease. She started Votrient (pazopanib) in March 2023. She underwent palliative resection of enlarging right chest wall mass in Jan 2024. She has had side effects of diarrhea since starting the pazopanib. She can have 1-2 soft, watery stools daily. She uses Imodium as needed and this works fairly well to limit diarrhea. She denies any issues with hematochezia or melena. No complaints of abdominal pain or abdominal cramping. Appetite and diet are stable without any unintentional weight loss. She does have mild GERD symptoms. She has to use TUMs at least 4 hours after she takes her oral chemo medication. She denies any complaints of nausea, vomiting, early satiety, esophageal dysphagia, odynophagia, or epigastric abdominal pain. She denies any other complaints or concerns at this time.      Current Facility-Administered Medications:    0.9 %  sodium chloride  infusion, , Intravenous, Continuous, [place], Danielle Loberg K, MD, Last Rate: 20 mL/hr at 11/29/23 9195, Continued from Pre-op at 11/29/23 0804  Medications Prior to Admission  Medication Sig Dispense Refill Last Dose/Taking   Calcium Carbonate-Vitamin D (CALCIUM + D PO) Take 1 tablet by mouth daily. CALCITRATE/VITAMIN D 315-200 MG-UNIT TABSGeneric: CALCIUM CITRATE-VITAMIN Dtwo by mouth every morning and one by mouth  every evening    11/28/2023   ferrous sulfate 325 (65 FE) MG EC tablet Take 325 mg by mouth.   Past Week   levothyroxine (SYNTHROID) 100 MCG tablet    11/28/2023   vitamin B-12 (CYANOCOBALAMIN) 1000 MCG tablet Take 1 tablet by mouth daily.   Past Week   acetaminophen  (TYLENOL ) 325 MG tablet Take 650 mg by mouth every 6 (six) hours as needed.      cyclobenzaprine (FLEXERIL) 5 MG tablet Take 5 mg by mouth 3 (three) times daily as needed.       hydrochlorothiazide (HYDRODIURIL) 25 MG tablet 25 mg.      lisinopril (ZESTRIL) 5 MG tablet    11/27/2023   metFORMIN (GLUCOPHAGE) 500 MG tablet Take 500 mg by mouth daily.    11/27/2023   ondansetron  (ZOFRAN ) 4 MG tablet Take 4 mg by mouth every 8 (eight) hours as needed.      simvastatin (ZOCOR) 20 MG tablet Take 20 mg by mouth daily.    11/27/2023   VOTRIENT 200 MG tablet Take 800 mg by mouth.   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    Physical Exam  Gen: Alert, oriented. Appears stated age.  HEENT: [place]/AT. PERRLA. Lungs: CTA, no wheezes. CV: RR nl S1, S2. Abd: soft, benign, no masses. BS+ Ext: No edema. Pulses 2+    Planned procedures: Proceed with colonoscopy. The patient understands the nature of the planned procedure, indications, risks, alternatives and potential complications including but not  limited to bleeding, infection, perforation, damage to internal organs and possible oversedation/side effects from anesthesia. The patient agrees and gives consent to proceed.  Please refer to procedure notes for findings, recommendations and patient disposition/instructions.     Danielle Hansen K. Aundria, M.D. Gastroenterology 11/29/2023  8:20 AM

## 2023-11-29 NOTE — Op Note (Signed)
 [facility] Gastroenterology Patient Name: Danielle Hansen Procedure Date: 11/29/2023 7:33 AM MRN: [id number] Account #: [id number] Date of Birth: 05/26/1950 Admit Type: Outpatient Age: 73 Room: [facility] ENDO ROOM 3 Gender: Female Note Status: Finalized Instrument Name: Colon Scope [id number] Procedure:             Colonoscopy Indications:           High risk colon cancer surveillance: Personal history                         of multiple (3 or more) adenomas Providers:             Mitra Duling K. Aundria MD, MD Referring MD:          [facility] (Referring MD) Medicines:             Propofol  per Anesthesia Complications:         No immediate complications. Estimated blood loss:                         Minimal. Procedure:             Pre-Anesthesia Assessment:                        - The risks and benefits of the procedure and the                         sedation options and risks were discussed with the                         patient. All questions were answered and informed                         consent was obtained.                        - Patient identification and proposed procedure were                         verified prior to the procedure by the nurse. The                         procedure was verified in the procedure room.                        - ASA Grade Assessment: III - A patient with severe                         systemic disease.                        - After reviewing the risks and benefits, the patient                         was deemed in satisfactory condition to undergo the                         procedure.                        After obtaining  informed consent, the colonoscope was                         passed under direct vision. Throughout the procedure,                         the patient's blood pressure, pulse, and oxygen                         saturations were monitored continuously. The                         Colonoscope was  introduced through the anus and                         advanced to the the cecum, identified by appendiceal                         orifice and ileocecal valve. The colonoscopy was                         performed without difficulty. The patient tolerated                         the procedure well. The quality of the bowel                         preparation was good. The ileocecal valve, appendiceal                         orifice, and rectum were photographed. Findings:      The perianal and digital rectal examinations were normal. Pertinent       negatives include normal sphincter tone and no palpable rectal lesions.      Non-bleeding internal hemorrhoids were found during retroflexion. The       hemorrhoids were Grade I (internal hemorrhoids that do not prolapse).      One 21 mm submucosal with overlying ulceration nodule was found in the       transverse colon. Biopsies were taken with a cold forceps for histology.       Estimated blood loss was minimal. Area was successfully injected with 5       mL Spot (carbon black) for tattooing. Estimated blood loss was minimal.      One 11 mm submucosal nodule was found at the hepatic flexure. Biopsies       were taken with a cold forceps for histology. Estimated blood loss was       minimal. Area was tattooed with an injection of 3 mL of Spot (carbon       black). Estimated blood loss was minimal.      The exam was otherwise without abnormality. Impression:            - Non-bleeding internal hemorrhoids.                        - Submucosal with overlying ulceration nodule in the                         transverse colon. Biopsied. Injected.                        -  Submucosal nodule at the hepatic flexure. Biopsied.                         Tattooed.                        - The examination was otherwise normal. Recommendation:        - Patient has a contact number available for                         emergencies. The signs and symptoms of  potential                         delayed complications were discussed with the patient.                         Return to normal activities tomorrow. Written                         discharge instructions were provided to the patient.                        - Continue present medications.                        - Await pathology results.                        - Repeat colonoscopy is recommended for surveillance.                         The colonoscopy date will be determined after                         pathology results from today's exam become available                         for review.                        - Return to GI office PRN.                        - The findings and recommendations were discussed with                         the patient. Procedure Code(s):     --- Professional ---                        [phone], Colonoscopy, flexible; with biopsy, single or                         multiple                        45381, Colonoscopy, flexible; with directed submucosal                         injection(s), any substance Diagnosis Code(s):     --- Professional ---  K64.0, First degree hemorrhoids                        K63.89, Other specified diseases of intestine                        Z86.010, Personal history of colonic polyps CPT copyright 2022 [employer]. All rights reserved. The codes documented in this report are preliminary and upon coder review may  be revised to meet current compliance requirements. Ladell MARLA Boss MD, MD 11/29/2023 8:58:31 AM This report has been signed electronically. Number of Addenda: 0 Note Initiated On: 11/29/2023 7:33 AM Scope Withdrawal Time: 0 hours 6 minutes 34 seconds  Total Procedure Duration: 0 hours 18 minutes 4 seconds  Estimated Blood Loss:  Estimated blood loss was minimal. Estimated blood loss                         was minimal.      [facility]

## 2023-11-30 LAB — SURGICAL PATHOLOGY
